# Patient Record
Sex: Male | Born: 1960
Health system: Southern US, Community
[De-identification: ages and names within clinical notes are randomized; demographics above are authoritative.]

## PROBLEM LIST (undated history)

## (undated) DIAGNOSIS — I10 Essential (primary) hypertension: Secondary | ICD-10-CM

---

## 1998-10-06 ENCOUNTER — Emergency Department (HOSPITAL_COMMUNITY): Admission: EM | Admit: 1998-10-06 | Discharge: 1998-10-06 | Payer: Self-pay | Admitting: Emergency Medicine

## 1999-03-08 ENCOUNTER — Ambulatory Visit (HOSPITAL_COMMUNITY): Admission: RE | Admit: 1999-03-08 | Discharge: 1999-03-08 | Payer: Self-pay

## 2006-11-25 ENCOUNTER — Emergency Department (HOSPITAL_COMMUNITY): Admission: EM | Admit: 2006-11-25 | Discharge: 2006-11-25 | Payer: Self-pay | Admitting: Emergency Medicine

## 2007-05-01 ENCOUNTER — Emergency Department (HOSPITAL_COMMUNITY): Admission: EM | Admit: 2007-05-01 | Discharge: 2007-05-01 | Payer: Self-pay | Admitting: Emergency Medicine

## 2007-10-04 ENCOUNTER — Emergency Department (HOSPITAL_COMMUNITY): Admission: EM | Admit: 2007-10-04 | Discharge: 2007-10-05 | Payer: Self-pay | Admitting: Emergency Medicine

## 2009-01-27 ENCOUNTER — Emergency Department (HOSPITAL_COMMUNITY): Admission: EM | Admit: 2009-01-27 | Discharge: 2009-01-27 | Payer: Self-pay | Admitting: Emergency Medicine

## 2009-09-29 ENCOUNTER — Emergency Department (HOSPITAL_COMMUNITY): Admission: EM | Admit: 2009-09-29 | Discharge: 2009-09-29 | Payer: Self-pay | Admitting: Emergency Medicine

## 2010-10-08 LAB — URINE CULTURE
Colony Count: NO GROWTH
Culture: NO GROWTH

## 2010-10-08 LAB — URINALYSIS, ROUTINE W REFLEX MICROSCOPIC
Bilirubin Urine: NEGATIVE
Glucose, UA: NEGATIVE mg/dL
Ketones, ur: NEGATIVE mg/dL
Nitrite: NEGATIVE
Protein, ur: NEGATIVE mg/dL
Specific Gravity, Urine: 1.02 (ref 1.005–1.030)
Urobilinogen, UA: 2 mg/dL — ABNORMAL HIGH (ref 0.0–1.0)
pH: 7 (ref 5.0–8.0)

## 2010-10-08 LAB — URINE MICROSCOPIC-ADD ON

## 2010-10-08 LAB — GC/CHLAMYDIA PROBE AMP, GENITAL
Chlamydia, DNA Probe: NEGATIVE
GC Probe Amp, Genital: NEGATIVE

## 2011-11-05 ENCOUNTER — Encounter (HOSPITAL_COMMUNITY): Payer: Self-pay | Admitting: *Deleted

## 2011-11-05 ENCOUNTER — Emergency Department (HOSPITAL_COMMUNITY)
Admission: EM | Admit: 2011-11-05 | Discharge: 2011-11-05 | Disposition: A | Discharge status: Home / Self Care | Payer: Self-pay | Attending: Emergency Medicine | Admitting: Emergency Medicine

## 2011-11-05 ENCOUNTER — Emergency Department (HOSPITAL_COMMUNITY): Payer: Self-pay

## 2011-11-05 DIAGNOSIS — F17200 Nicotine dependence, unspecified, uncomplicated: Secondary | ICD-10-CM | POA: Insufficient documentation

## 2011-11-05 DIAGNOSIS — R0789 Other chest pain: Secondary | ICD-10-CM

## 2011-11-05 DIAGNOSIS — R071 Chest pain on breathing: Secondary | ICD-10-CM | POA: Insufficient documentation

## 2011-11-05 MED ORDER — DIAZEPAM 5 MG PO TABS
10.0000 mg | ORAL_TABLET | Freq: Once | ORAL | Status: AC
  Administered 2011-11-05: 10 mg via ORAL
  Filled 2011-11-05: qty 2

## 2011-11-05 MED ORDER — OXYCODONE-ACETAMINOPHEN 5-325 MG PO TABS
2.0000 | ORAL_TABLET | Freq: Once | ORAL | Status: AC
  Administered 2011-11-05: 2 via ORAL
  Filled 2011-11-05: qty 2

## 2011-11-05 MED ORDER — OXYCODONE-ACETAMINOPHEN 5-325 MG PO TABS: 2.0000 | ORAL_TABLET | Freq: Four times a day (QID) | ORAL | Status: AC | PRN

## 2011-11-05 MED ORDER — KETOROLAC TROMETHAMINE 30 MG/ML IJ SOLN
30.0000 mg | Freq: Once | INTRAMUSCULAR | Status: AC
  Administered 2011-11-05: 30 mg via INTRAMUSCULAR
  Filled 2011-11-05: qty 1

## 2011-11-05 MED ORDER — DIAZEPAM 10 MG PO TABS: 5.0000 mg | ORAL_TABLET | Freq: Four times a day (QID) | ORAL | Status: AC | PRN

## 2011-11-05 MED ORDER — KETOROLAC TROMETHAMINE 10 MG PO TABS: 10.0000 mg | ORAL_TABLET | Freq: Four times a day (QID) | ORAL | Status: AC | PRN

## 2011-11-05 NOTE — ED Notes (Signed)
Pt reports right rib pain - states he had a cold x2 weeks ago however his cough never seemed to bother him. Pain is worse w/ movement and tender to palpation. Pt in no acute distress, resp even and unlabored at present.

## 2011-11-05 NOTE — ED Notes (Signed)
Rx given x3 D/c instructions reviewed w/ pt - pt denies any further questions or concerns at present.    

## 2011-11-05 NOTE — Discharge Instructions (Signed)
Chest Wall Pain Chest wall pain is pain in or around the bones and muscles of your chest. It may take up to 6 weeks to get better. It may take longer if you must stay physically active in your work and activities.  CAUSES  Chest wall pain may happen on its own. However, it may be caused by:  A viral illness like the flu.   Injury.   Coughing.   Exercise.   Arthritis.   Fibromyalgia.   Shingles.  HOME CARE INSTRUCTIONS   Avoid overtiring physical activity. Try not to strain or perform activities that cause pain. This includes any activities using your chest or your abdominal and side muscles, especially if heavy weights are used.   Put ice on the sore area.   Put ice in a plastic bag.   Place a towel between your skin and the bag.   Leave the ice on for 15 to 20 minutes per hour while awake for the first 2 days.   Only take over-the-counter or prescription medicines for pain, discomfort, or fever as directed by your caregiver.  SEEK IMMEDIATE MEDICAL CARE IF:   Your pain increases, or you are very uncomfortable.   You have a fever.   Your chest pain becomes worse.   You have new, unexplained symptoms.   You have nausea or vomiting.   You feel sweaty or lightheaded.   You have a cough with phlegm (sputum), or you cough up blood.  MAKE SURE YOU:   Understand these instructions.   Will watch your condition.   Will get help right away if you are not doing well or get worse.  Document Released: 07/01/2005 Document Revised: 06/20/2011 Document Reviewed: 02/25/2011 [employer] Patient Information 2012 [employer]. 

## 2011-11-05 NOTE — ED Notes (Signed)
The pt has had rt lower rib pain since yesterday.  The pain is much worse with movement and with respirations.  No known injury

## 2011-11-05 NOTE — ED Provider Notes (Signed)
Medical screening examination/treatment/procedure(s) were performed by non-physician practitioner and as supervising physician I was immediately available for consultation/collaboration.   Vida Roller, MD 11/05/11 [phone]
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
## 2011-11-05 NOTE — ED Notes (Signed)
Patient transported to X-ray 

## 2019-05-18 ENCOUNTER — Emergency Department (HOSPITAL_COMMUNITY)
Admission: EM | Admit: 2019-05-18 | Discharge: 2019-05-19 | Disposition: A | Discharge status: Home / Self Care | Payer: Self-pay | Attending: Emergency Medicine | Admitting: Emergency Medicine

## 2019-05-18 ENCOUNTER — Other Ambulatory Visit: Payer: Self-pay

## 2019-05-18 ENCOUNTER — Emergency Department (HOSPITAL_COMMUNITY): Payer: Self-pay

## 2019-05-18 DIAGNOSIS — F17210 Nicotine dependence, cigarettes, uncomplicated: Secondary | ICD-10-CM | POA: Insufficient documentation

## 2019-05-18 DIAGNOSIS — S93402A Sprain of unspecified ligament of left ankle, initial encounter: Secondary | ICD-10-CM | POA: Insufficient documentation

## 2019-05-18 DIAGNOSIS — M25572 Pain in left ankle and joints of left foot: Secondary | ICD-10-CM

## 2019-05-18 DIAGNOSIS — Y939 Activity, unspecified: Secondary | ICD-10-CM | POA: Insufficient documentation

## 2019-05-18 DIAGNOSIS — Y929 Unspecified place or not applicable: Secondary | ICD-10-CM | POA: Insufficient documentation

## 2019-05-18 DIAGNOSIS — W010XXA Fall on same level from slipping, tripping and stumbling without subsequent striking against object, initial encounter: Secondary | ICD-10-CM | POA: Insufficient documentation

## 2019-05-18 DIAGNOSIS — Y999 Unspecified external cause status: Secondary | ICD-10-CM | POA: Insufficient documentation

## 2019-05-18 NOTE — ED Triage Notes (Signed)
Per pt he stepped down into a hole with his left foot and twisted it. Said he rolled it. No obvious deformity.

## 2019-05-19 NOTE — ED Notes (Signed)
Ankle ASO applied

## 2019-05-19 NOTE — ED Provider Notes (Signed)
[facility] EMERGENCY DEPARTMENT Provider Note   CSN: [id number] Arrival date & time: 05/18/19  2303     History   Chief Complaint Chief Complaint  Patient presents with  . Foot Pain    HPI DYON ROTERT is a 58 y.o. male presenting for evaluation of left ankle pain.  Patient states a day before yesterday he stepped down off a large step when he sharply inverted his left ankle.  He reports acute onset pain.  Pain has been constant since, worse with movement and weightbearing.  He took 400 mg of ibuprofen yesterday which did improve his pain. He ha snot taken anything else. However as he still has pain and swelling, he came to the ED to be evaluated.  He denies injury elsewhere.  He has no medical problems and takes medications daily.  He is not on blood thinners.  He denies numbness or tingling.  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     HPI  No past medical history on file.  There are no active problems to display for this patient.   No past surgical history on file.      Home Medications    Prior to Admission medications   Not on File    Family History No family history on file.  Social History Social History   Tobacco Use  . Smoking status: Current Every Day Smoker  Substance Use Topics  . Alcohol use: Yes  . Drug use: Not on file     Allergies   Patient has no known allergies.   Review of Systems Review of Systems  Musculoskeletal: Positive for arthralgias and joint swelling.  Hematological: Does not bruise/bleed easily.     Physical Exam Updated Vital Signs BP (!) 135/117 (BP Location: Right Arm)   Pulse 92   Temp 98.1 F (36.7 C)   Resp 17   SpO2 97%   Physical Exam Vitals signs and nursing note reviewed.  Constitutional:      General: He is not in acute distress.    Appearance: He is well-developed.     Comments: Resting comfortably in the bed in no acute  distress  HENT:     Head: Normocephalic and atraumatic.  Neck:     Musculoskeletal: Normal range of motion.  Pulmonary:     Effort: Pulmonary effort is normal.  Abdominal:     General: There is no distension.  Musculoskeletal:        General: Swelling and tenderness present.     Comments: Swelling of the lateral malleolus.  Numerous palpation over the lateral malleolus.  Achilles tendon palpable and intact.  No tenderness palpation of the medial malleolus or foot.  Pedal pulse intact.  Good distal cap refill.  Full active range of motion of the toes without difficulty.  No pain of the distal lower leg.  Skin:    General: Skin is warm.     Capillary Refill: Capillary refill takes less than 2 seconds.     Findings: No rash.  Neurological:     Mental Status: He is alert and oriented to person, place, and time.      ED Treatments / Results  Labs (all labs ordered are listed, but only abnormal results are displayed) Labs Reviewed - No data to display  EKG None  Radiology Dg Ankle Complete Left  Result Date: 05/18/2019 CLINICAL DATA:  Twisting injury EXAM: LEFT ANKLE COMPLETE -  3+ VIEW COMPARISON:  None. FINDINGS: Lateral soft tissue swelling. No acute bony abnormality. Specifically, no fracture, subluxation, or dislocation. IMPRESSION: No acute bony abnormality. Electronically Signed   By: Rolm Baptise M.D.   On: 05/18/2019 23:57    Procedures Procedures (including critical care time)  Medications Ordered in ED Medications - No data to display   Initial Impression / Assessment and Plan / ED Course  I have reviewed the triage vital signs and the nursing notes.  Pertinent labs & imaging results that were available during my care of the patient were reviewed by me and considered in my medical decision making (see chart for details).        Patient presenting for evaluation of left ankle pain.  Physical exam reassuring, he appears nontoxic.  He is neurovascularly intact.   As he has swelling, pain, and an injury several days ago, will obtain x-rays.  X-rays viewed interpreted by me, no fracture or dislocation.  Discussed findings with patient.  Discussed likely ankle sprain and supportive treatment with NSAIDs, Tylenol, rest, ice, elevation.  Will give ASO brace for symptom control.  Encourage follow-up with orthopedics if symptoms not improving.  At this time, patient appears safe for discharge.  Return precautions given.  Patient states he understands and agrees to plan.  Final Clinical Impressions(s) / ED Diagnoses   Final diagnoses:  Acute left ankle pain  Sprain of left ankle, unspecified ligament, initial encounter    ED Discharge Orders    None       Franchot Heidelberg, PA-C 05/19/19 1610    Tegeler, Gwenyth Allegra, MD 05/19/19 1114

## 2019-05-19 NOTE — Discharge Instructions (Signed)
Take ibuprofen 3 times a day with meals.  Do not take other anti-inflammatories at the same time (Advil, Motrin, naproxen, Aleve). You may supplement with Tylenol if you need further pain control. Use ice packs, 20 minutes at a time, 3 times a day for pain and swelling.  Wear the brace for support and pain.  Keep your foot elevated of the neck several days to decrease pain and swelling. Follow-up with orthopedic doctor in 1 week if your pain is not proving. Return to the emergency room if you develop numbness, your foot turns white, or with any new, worsening, concerning symptoms.

## 2019-10-15 ENCOUNTER — Emergency Department (HOSPITAL_COMMUNITY)
Admission: EM | Admit: 2019-10-15 | Discharge: 2019-10-15 | Disposition: A | Discharge status: Home / Self Care | Payer: Self-pay | Attending: Emergency Medicine | Admitting: Emergency Medicine

## 2019-10-15 ENCOUNTER — Encounter (HOSPITAL_COMMUNITY): Payer: Self-pay | Admitting: Emergency Medicine

## 2019-10-15 ENCOUNTER — Other Ambulatory Visit: Payer: Self-pay

## 2019-10-15 DIAGNOSIS — F17200 Nicotine dependence, unspecified, uncomplicated: Secondary | ICD-10-CM | POA: Insufficient documentation

## 2019-10-15 DIAGNOSIS — M5441 Lumbago with sciatica, right side: Secondary | ICD-10-CM | POA: Insufficient documentation

## 2019-10-15 MED ORDER — KETOROLAC TROMETHAMINE 30 MG/ML IJ SOLN
30.0000 mg | Freq: Once | INTRAMUSCULAR | Status: AC
  Administered 2019-10-15: 15:00:00 30 mg via INTRAMUSCULAR
  Filled 2019-10-15: qty 1

## 2019-10-15 MED ORDER — LIDOCAINE 5 % EX PTCH
1.0000 | MEDICATED_PATCH | CUTANEOUS | Status: DC
  Administered 2019-10-15: 15:00:00 1 via TRANSDERMAL
  Filled 2019-10-15: qty 1

## 2019-10-15 MED ORDER — LIDOCAINE 5 % EX PTCH: 1.0000 | MEDICATED_PATCH | CUTANEOUS | 0 refills | Status: DC

## 2019-10-15 MED ORDER — METHOCARBAMOL 500 MG PO TABS: 500.0000 mg | ORAL_TABLET | Freq: Two times a day (BID) | ORAL | 0 refills | Status: DC

## 2019-10-15 MED ORDER — NAPROXEN 500 MG PO TABS: 500.0000 mg | ORAL_TABLET | Freq: Two times a day (BID) | ORAL | 0 refills | Status: DC

## 2019-10-15 NOTE — Discharge Instructions (Signed)
Take medications as prescribed.  If your pain is not resolved follow-up with orthopedics.  I have listed their contact information in your discharge paperwork.  You will need to call them to schedule an appointment.

## 2019-10-15 NOTE — ED Triage Notes (Signed)
Reports ongoing R lower back pain that has gotten worse recently with radiation to R leg, c/o pins and needle like sensation to R leg at times. States pain is worse due to his job. Denies loss of bowel or bladder.

## 2019-10-15 NOTE — ED Provider Notes (Signed)
[facility] EMERGENCY DEPARTMENT Provider Note   CSN: [id number] Arrival date & time: 10/15/19  1432    History Chief Complaint  Patient presents with  . Back Pain    Nicholas Roth is a 59 y.o. male with medical history significant for chronic back pain who presents for evaluation of back pain.  Patient was diagnosed with sciatica many years ago and typically has 2-3 flares a year.  Patient states he recently started a new job does a lot of bending, twisting, walking.  He has pain located to his right lumbar paraspinal region which radiates down his buttocks and into his posterior leg.  Denies fever, chills, nausea, vomiting, IV drug use, bowel or bladder incontinence, saddle paresthesias, malignancy, redness, swelling, warmth, abdominal pain.  Has been intermittently taking Tylenol for his pain.  Pain worse with movement.  Denies aggravating or alleviating factors.  Feels similar to his prior episodes of sciatica.  History obtained from patient and past medical records.  No interpreter is used.  HPI     History reviewed. No pertinent past medical history.  There are no problems to display for this patient.   History reviewed. No pertinent surgical history.     No family history on file.  Social History   Tobacco Use  . Smoking status: Current Every Day Smoker  Substance Use Topics  . Alcohol use: Yes  . Drug use: Not on file    Home Medications Prior to Admission medications   Medication Sig Start Date End Date Taking? Authorizing Provider  lidocaine (LIDODERM) 5 % Place 1 patch onto the skin daily. Remove & Discard patch within 12 hours or as directed by MD 10/15/19   Asuzena Weis A, PA-C  methocarbamol (ROBAXIN) 500 MG tablet Take 1 tablet (500 mg total) by mouth 2 (two) times daily. 10/15/19   Karee Forge A, PA-C  naproxen (NAPROSYN) 500 MG tablet Take 1 tablet (500 mg total) by mouth 2 (two) times daily. 10/15/19   Macie Baum A, PA-C     Allergies    Patient has no known allergies.  Review of Systems   Review of Systems  Constitutional: Negative.   HENT: Negative.   Respiratory: Negative.   Cardiovascular: Negative.   Gastrointestinal: Negative.   Genitourinary: Negative.   Musculoskeletal: Positive for back pain.  Skin: Negative.   Neurological: Negative.   All other systems reviewed and are negative.   Physical Exam Updated Vital Signs BP (!) 113/92   Pulse 87   Temp 98.5 F (36.9 C) (Oral)   Resp 16   Ht 5\' 8"  (1.727 m)   Wt 81.6 kg   SpO2 97%   BMI 27.37 kg/m   Physical Exam Physical Exam  Constitutional: Pt appears well-developed and well-nourished. No distress.  HENT:  Head: Normocephalic and atraumatic.  Mouth/Throat: Oropharynx is clear and moist. No oropharyngeal exudate.  Eyes: Conjunctivae are normal.  Neck: Normal range of motion. Neck supple.  Full ROM without pain  Cardiovascular: Normal rate, regular rhythm and intact distal pulses.   Pulmonary/Chest: Effort normal and breath sounds normal. No respiratory distress. Pt has no wheezes.  Abdominal: Soft. Pt exhibits no distension. There is no tenderness, rebound or guarding. No abd bruit or pulsatile mass Musculoskeletal:  Full range of motion of the T-spine and L-spine with flexion, hyperextension, and lateral flexion. No midline tenderness or stepoffs. No tenderness to palpation of the spinous processes of the T-spine or L-spine. Mild tenderness to palpation of the  paraspinous muscles of the RIGHT L-spine. Positive  straight leg raise on right at 40' Lymphadenopathy:    Pt has no cervical adenopathy.  Neurological: Pt is alert. Pt has normal reflexes.  Reflex Scores:      Bicep reflexes are 2+ on the right side and 2+ on the left side.      Brachioradialis reflexes are 2+ on the right side and 2+ on the left side.      Patellar reflexes are 2+ on the right side and 2+ on the left side.      Achilles reflexes are 2+ on the right  side and 2+ on the left side. Speech is clear and goal oriented, follows commands Normal 5/5 strength in upper and lower extremities bilaterally including dorsiflexion and plantar flexion, strong and equal grip strength Sensation normal to light and sharp touch Moves extremities without ataxia, coordination intact Normal gait Normal balance No Clonus Skin: Skin is warm and dry. No rash noted or lesions noted. Pt is not diaphoretic. No erythema, ecchymosis,edema or warmth.  Psychiatric: Pt has a normal mood and affect. Behavior is normal.  Nursing note and vitals reviewed. ED Results / Procedures / Treatments   Labs (all labs ordered are listed, but only abnormal results are displayed) Labs Reviewed - No data to display  EKG None  Radiology No results found.  Procedures Procedures (including critical care time)  Medications Ordered in ED Medications  ketorolac (TORADOL) 30 MG/ML injection 30 mg (has no administration in time range)  lidocaine (LIDODERM) 5 % 1 patch (has no administration in time range)    ED Course  I have reviewed the triage vital signs and the nursing notes.  Pertinent labs & imaging results that were available during my care of the patient were reviewed by me and considered in my medical decision making (see chart for details).  59 year old male presents for evaluation of back pain.  Longstanding history of sciatica.  Started a job which is physically demanding.  His pain is worse with movement.  Radiates down his posterior buttocks.  No red flags for back pain, low suspicion for acute neurosurgical emergency.  He is ambulatory without difficulty.  Heart and lungs clear.  Abdomen soft.  No overlying skin changes to suggest cellulitis.  Full range of motion bilateral hips, low suspicion for septic joint.  He does not have any new injury or trauma.  Do not feel any additional imaging at this time.  Will treat symptomatically and have him follow-up outpatient with  orthopedics.  He is to return for any new or worsening symptoms.  The patient has been appropriately medically screened and/or stabilized in the ED. I have low suspicion for any other emergent medical condition which would require further screening, evaluation or treatment in the ED or require inpatient management.    MDM Rules/Calculators/A&P                      Final Clinical Impression(s) / ED Diagnoses Final diagnoses:  Acute right-sided low back pain with right-sided sciatica    Rx / DC Orders ED Discharge Orders         Ordered    lidocaine (LIDODERM) 5 %  Every 24 hours     10/15/19 1507    naproxen (NAPROSYN) 500 MG tablet  2 times daily     10/15/19 1507    methocarbamol (ROBAXIN) 500 MG tablet  2 times daily     10/15/19 1507  Freman Lapage A, PA-C 10/15/19 1507    Arby Barrette, MD 10/26/19 [phone]

## 2019-12-08 ENCOUNTER — Emergency Department (HOSPITAL_COMMUNITY)
Admission: EM | Admit: 2019-12-08 | Discharge: 2019-12-08 | Disposition: A | Discharge status: Home / Self Care | Payer: Self-pay | Attending: Emergency Medicine | Admitting: Emergency Medicine

## 2019-12-08 ENCOUNTER — Other Ambulatory Visit: Payer: Self-pay

## 2019-12-08 ENCOUNTER — Encounter (HOSPITAL_COMMUNITY): Payer: Self-pay

## 2019-12-08 DIAGNOSIS — S161XXA Strain of muscle, fascia and tendon at neck level, initial encounter: Secondary | ICD-10-CM | POA: Insufficient documentation

## 2019-12-08 DIAGNOSIS — Y999 Unspecified external cause status: Secondary | ICD-10-CM | POA: Insufficient documentation

## 2019-12-08 DIAGNOSIS — Y939 Activity, unspecified: Secondary | ICD-10-CM | POA: Insufficient documentation

## 2019-12-08 DIAGNOSIS — X58XXXA Exposure to other specified factors, initial encounter: Secondary | ICD-10-CM | POA: Insufficient documentation

## 2019-12-08 DIAGNOSIS — Y929 Unspecified place or not applicable: Secondary | ICD-10-CM | POA: Insufficient documentation

## 2019-12-08 DIAGNOSIS — F17210 Nicotine dependence, cigarettes, uncomplicated: Secondary | ICD-10-CM | POA: Insufficient documentation

## 2019-12-08 MED ORDER — PREDNISONE 20 MG PO TABS: 40.0000 mg | ORAL_TABLET | Freq: Every day | ORAL | 0 refills | Status: AC

## 2019-12-08 MED ORDER — NAPROXEN 500 MG PO TABS: 500.0000 mg | ORAL_TABLET | Freq: Two times a day (BID) | ORAL | 0 refills | Status: AC

## 2019-12-08 MED ORDER — LIDOCAINE 5 % EX PTCH
1.0000 | MEDICATED_PATCH | Freq: Once | CUTANEOUS | Status: DC
Start: 2019-12-08 — End: 2019-12-08
  Administered 2019-12-08: 1 via TRANSDERMAL
  Filled 2019-12-08: qty 1

## 2019-12-08 MED ORDER — PREDNISONE 20 MG PO TABS
60.0000 mg | ORAL_TABLET | Freq: Once | ORAL | Status: AC
  Administered 2019-12-08: 60 mg via ORAL
  Filled 2019-12-08: qty 3

## 2019-12-08 NOTE — ED Triage Notes (Signed)
Pt reports bilateral neck pain and arm pain for the past 3 days. Pt ambulatory

## 2019-12-08 NOTE — Discharge Instructions (Addendum)
You have been seen today for neck and arm pain. Please read and follow all provided instructions. Return to the emergency room for worsening condition or new concerning symptoms.    1. Medications:  Prescription sent to your pharmacy for prednisone.  Please start taking this tomorrow.  You are already given a dose today in the emergency department.  -Prescription also sent for naproxen.  This is an anti-inflammatory pain medicine.  Please take as prescribed.  Take with food so it does not cause upset stomach.  Do not take any additional ibuprofen, Aleve, Motrin, Goody powder at these medicines are all similar.  -You can take Tylenol for pain.  Take as directed on the bottle.  Continue usual home medications Take medications as prescribed. Please review all of the medicines and only take them if you do not have an allergy to them.   2. Treatment: rest, drink plenty of fluids  3. Follow Up:  Please follow up with primary care provider by scheduling an appointment as soon as possible for a visit  If you do not have a primary care physician, contact [facility] at [phone] for referral  -I have also given you information for [facility].  They see patients without insurance.  You can call to schedule a follow-up appointment if you continue to have pain in your neck.   It is also a possibility that you have an allergic reaction to any of the medicines that you have been prescribed - Everybody reacts differently to medications and while MOST people have no trouble with most medicines, you may have a reaction such as nausea, vomiting, rash, swelling, shortness of breath. If this is the case, please stop taking the medicine immediately and contact your physician.  ?

## 2019-12-08 NOTE — ED Provider Notes (Signed)
[facility] EMERGENCY DEPARTMENT Provider Note   CSN: [id number] Arrival date & time: 12/08/19  9622     History Chief Complaint  Patient presents with  . Neck Pain  . Arm Pain    Nicholas Roth is a 59 y.o. male with past medical history significant for chronic back pain presents to emergency department today with chief complaint of progressively worsening neck and bilateral arm pain x 3 days.  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  He denies any known injury, fall or trauma.  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  He states later that night he had an aching pain in his neck.  The pain radiates to bilateral arms.  He states the pain is constant and is worse with movement.  He has tried taking Tylenol at home without any symptom relief.  He rates the pain 7 out of 10 in severity.  He denies any fever, chills, numbness, tingling, weakness, decreased sensation.  He is able to walk without any pain or difficulty.   History reviewed. No pertinent past medical history.  There are no problems to display for this patient.   History reviewed. No pertinent surgical history.     No family history on file.  Social History   Tobacco Use  . Smoking status: Current Every Day Smoker  Substance Use Topics  . Alcohol use: Yes  . Drug use: Not on file    Home Medications Prior to Admission medications   Medication Sig Start Date End Date Taking? Authorizing Provider  lidocaine (LIDODERM) 5 % Place 1 patch onto the skin daily. Remove & Discard patch within 12 hours or as directed by MD 10/15/19   Henderly, Britni A, PA-C  methocarbamol (ROBAXIN) 500 MG tablet Take 1 tablet (500 mg total) by mouth 2 (two) times daily. 10/15/19   Henderly, Britni A, PA-C  naproxen (NAPROSYN) 500 MG tablet Take 1 tablet (500 mg total) by mouth 2 (two) times daily for 7 days. 12/08/19 12/15/19  Avyonna Wagoner E, PA-C  predniSONE  (DELTASONE) 20 MG tablet Take 2 tablets (40 mg total) by mouth daily for 5 days. 12/08/19 12/13/19  Zamiah Tollett, Caroleen Hamman, PA-C    Allergies    Patient has no known allergies.  Review of Systems   Review of Systems All other systems are reviewed and are negative for acute change except as noted in the HPI.  Physical Exam Updated Vital Signs BP 126/85 (BP Location: Right Arm)   Pulse 73   Temp 97.9 F (36.6 C)   Resp 16   Ht 5\' 8"  (1.727 m)   Wt 72.6 kg   SpO2 100%   BMI 24.33 kg/m   Physical Exam Vitals and nursing note reviewed.  Constitutional:      Appearance: He is well-developed. He is not ill-appearing or toxic-appearing.  HENT:     Head: Normocephalic and atraumatic.     Nose: Nose normal.  Eyes:     General: No scleral icterus.       Right eye: No discharge.        Left eye: No discharge.     Conjunctiva/sclera: Conjunctivae normal.  Neck:     Vascular: No JVD.      Comments: Tenderness to palpation as depicted in image above. Full ROM intact without spinous process TTP. No bony stepoffs or deformities. No rigidity or meningeal signs. No bruising, erythema, or swelling.  Cardiovascular:  Rate and Rhythm: Normal rate and regular rhythm.     Pulses: Normal pulses.          Radial pulses are 2+ on the right side and 2+ on the left side.     Heart sounds: Normal heart sounds.  Pulmonary:     Effort: Pulmonary effort is normal.     Breath sounds: Normal breath sounds.  Abdominal:     General: There is no distension.  Musculoskeletal:        General: Normal range of motion.     Right shoulder: Normal.     Left shoulder: Normal.     Right elbow: Normal.     Left elbow: Normal. Effusion:       Right wrist: Normal.     Left wrist: Normal.     Cervical back: Normal range of motion.     Comments: Bilateral upper extremities are neurovascularly intact distally. Compartments soft above and below affected joint.   Full range of motion of the T-spine and L-spine  No tenderness to palpation of the spinous processes of the T-spine or L-spine No crepitus, deformity or step-offs No tenderness to palpation of the paraspinous muscles of the L-spine      Skin:    General: Skin is warm and dry.  Neurological:     Mental Status: He is oriented to person, place, and time.     GCS: GCS eye subscore is 4. GCS verbal subscore is 5. GCS motor subscore is 6.     Comments: Mental Status:  Alert, oriented, thought content appropriate, able to give a coherent history. Speech fluent without evidence of aphasia. Able to follow 2 step commands without difficulty.  Cranial Nerves:  II:  Peripheral visual fields grossly normal, pupils equal, round, reactive to light III,IV, VI: ptosis not present, extra-ocular motions intact bilaterally  V,VII: smile symmetric, facial light touch sensation equal VIII: hearing grossly normal to voice  X: uvula elevates symmetrically  XI: bilateral shoulder shrug symmetric and strong XII: midline tongue extension without fassiculations Motor:  Normal tone. 5/5 in upper and lower extremities bilaterally including strong and equal grip strength and dorsiflexion/plantar flexion Sensory: Pinprick and light touch normal in all extremities.  Deep Tendon Reflexes: 2+ and symmetric in the biceps and patella Cerebellar: normal finger-to-nose with bilateral upper extremities Gait: normal gait and balance CV: distal pulses palpable throughout     Psychiatric:        Behavior: Behavior normal.     ED Results / Procedures / Treatments   Labs (all labs ordered are listed, but only abnormal results are displayed) Labs Reviewed - No data to display  EKG None  Radiology No results found.  Procedures Procedures (including critical care time)  Medications Ordered in ED Medications  lidocaine (LIDODERM) 5 % 1 patch (1 patch Transdermal Patch Applied 12/08/19 1034)  predniSONE (DELTASONE) tablet 60 mg (60 mg Oral Given 12/08/19 1033)     ED Course  I have reviewed the triage vital signs and the nursing notes.  Pertinent labs & imaging results that were available during my care of the patient were reviewed by me and considered in my medical decision making (see chart for details).    MDM Rules/Calculators/A&P                     History provided by patient with additional history obtained from chart review.    Patient seen and examined. Patient presents awake, alert, hemodynamically stable, afebrile,  non toxic. On exam he has tenderness to palpation of bilateral paraspinal muscles of cervical spine.  No midline tenderness.  No step-off crepitus or deformity.  He has full range of motion of bilateral arms.  Compartments are soft in bilateral upper extremities.  Radial pulse 2+ bilaterally.  He ambulates with without any deficits noted. Normal neuro exam. Pain likely caused by MSK strain, pinched nerve.  Lidocaine patch applied to neck and patient given dose of prednisone here in the emergency department.  Will discharge home with symptomatic care including naproxen and a short prednisone burst. The patient appears reasonably screened and/or stabilized for discharge and I doubt any other medical condition or other [facility] requiring further screening, evaluation, or treatment in the ED at this time prior to discharge. The patient is safe for discharge with strict return precautions discussed. Recommend pcp follow up if symptoms persist.  Patient given resources for [facility] health community health and wellness clinic as he does not have a PCP.   Portions of this note were generated with Lobbyist. Dictation errors may occur despite best attempts at proofreading.    Final Clinical Impression(s) / ED Diagnoses Final diagnoses:  Acute strain of neck muscle, initial encounter    Rx / DC Orders ED Discharge Orders         Ordered    naproxen (NAPROSYN) 500 MG tablet  2 times daily     12/08/19 1010    predniSONE  (DELTASONE) 20 MG tablet  Daily     12/08/19 1010           Cherre Robins, PA-C 12/08/19 Dumont, Ankit, MD 12/09/19 1149

## 2020-01-18 ENCOUNTER — Encounter (HOSPITAL_COMMUNITY): Payer: Self-pay | Admitting: Emergency Medicine

## 2020-01-18 ENCOUNTER — Emergency Department (HOSPITAL_COMMUNITY): Payer: Self-pay

## 2020-01-18 ENCOUNTER — Emergency Department (HOSPITAL_COMMUNITY)
Admission: EM | Admit: 2020-01-18 | Discharge: 2020-01-18 | Disposition: A | Discharge status: Home / Self Care | Payer: Self-pay | Attending: Emergency Medicine | Admitting: Emergency Medicine

## 2020-01-18 ENCOUNTER — Other Ambulatory Visit: Payer: Self-pay

## 2020-01-18 DIAGNOSIS — F17210 Nicotine dependence, cigarettes, uncomplicated: Secondary | ICD-10-CM | POA: Insufficient documentation

## 2020-01-18 DIAGNOSIS — S161XXS Strain of muscle, fascia and tendon at neck level, sequela: Secondary | ICD-10-CM | POA: Insufficient documentation

## 2020-01-18 DIAGNOSIS — Z79899 Other long term (current) drug therapy: Secondary | ICD-10-CM | POA: Insufficient documentation

## 2020-01-18 DIAGNOSIS — X58XXXS Exposure to other specified factors, sequela: Secondary | ICD-10-CM | POA: Insufficient documentation

## 2020-01-18 LAB — CBC
HCT: 43.6 % (ref 39.0–52.0)
Hemoglobin: 14.5 g/dL (ref 13.0–17.0)
MCH: 31.6 pg (ref 26.0–34.0)
MCHC: 33.3 g/dL (ref 30.0–36.0)
MCV: 95 fL (ref 80.0–100.0)
Platelets: 245 10*3/uL (ref 150–400)
RBC: 4.59 MIL/uL (ref 4.22–5.81)
RDW: 14.3 % (ref 11.5–15.5)
WBC: 6 10*3/uL (ref 4.0–10.5)
nRBC: 0 % (ref 0.0–0.2)

## 2020-01-18 LAB — BASIC METABOLIC PANEL
Anion gap: 12 (ref 5–15)
BUN: 5 mg/dL — ABNORMAL LOW (ref 6–20)
CO2: 23 mmol/L (ref 22–32)
Calcium: 8.5 mg/dL — ABNORMAL LOW (ref 8.9–10.3)
Chloride: 102 mmol/L (ref 98–111)
Creatinine, Ser: 0.97 mg/dL (ref 0.61–1.24)
GFR calc Af Amer: >60 mL/min (ref >60)
GFR calc non Af Amer: >60 mL/min (ref >60)
Glucose, Bld: 102 mg/dL — ABNORMAL HIGH (ref 70–99)
Potassium: 3.2 mmol/L — ABNORMAL LOW (ref 3.5–5.1)
Sodium: 137 mmol/L (ref 135–145)

## 2020-01-18 MED ORDER — CYCLOBENZAPRINE HCL 10 MG PO TABS: 10.0000 mg | ORAL_TABLET | Freq: Three times a day (TID) | ORAL | 0 refills | Status: AC

## 2020-01-18 MED ORDER — KETOROLAC TROMETHAMINE 30 MG/ML IJ SOLN
30.0000 mg | Freq: Once | INTRAMUSCULAR | Status: AC
  Administered 2020-01-18: 30 mg via INTRAMUSCULAR
  Filled 2020-01-18: qty 1

## 2020-01-18 MED ORDER — PREDNISONE 20 MG PO TABS: 20.0000 mg | ORAL_TABLET | Freq: Every day | ORAL | 0 refills | Status: AC

## 2020-01-18 NOTE — ED Provider Notes (Signed)
[facility] EMERGENCY DEPARTMENT Provider Note   CSN: [id number] Arrival date & time: 01/18/20  1518     History Chief Complaint  Patient presents with  . Neck Pain    Nicholas Roth is a 59 y.o. male.  59 y.o male with a PMH of neck pain presents to the ED with a chief complaint of right sided neck pain x 1 month. He describes a sharp pain to the right sided of his neck "pins and needles" which radiates down his right arm. Pain is exacerbated in the morning especially when getting out of bed. He was previously seen in the ED, for the same complaint reports there was improvement with the medication he took however pain has not returned.  He was encouraged to follow-up with a specialist, reports he was unable to do so due to financial strain.  He has tried Tylenol, ibuprofen, heat pad with some improvement.  Patient is currently employed as a Museum/gallery exhibitions officer. No fever, no headache, no changes in vision, no chest pain or shortness of breath.     The history is provided by the patient.  Neck Pain Associated symptoms: no chest pain, no fever, no headaches and no weakness        History reviewed. No pertinent past medical history.  There are no problems to display for this patient.   History reviewed. No pertinent surgical history.     No family history on file.  Social History   Tobacco Use  . Smoking status: Current Every Day Smoker  Substance Use Topics  . Alcohol use: Yes  . Drug use: Not on file    Home Medications Prior to Admission medications   Medication Sig Start Date End Date Taking? Authorizing Provider  cyclobenzaprine (FLEXERIL) 10 MG tablet Take 1 tablet (10 mg total) by mouth 3 (three) times daily for 7 days. 01/18/20 01/25/20  Claude Manges, PA-C  lidocaine (LIDODERM) 5 % Place 1 patch onto the skin daily. Remove & Discard patch within 12 hours or as directed by MD 10/15/19   Henderly, Britni A, PA-C  methocarbamol (ROBAXIN) 500 MG  tablet Take 1 tablet (500 mg total) by mouth 2 (two) times daily. 10/15/19   Henderly, Britni A, PA-C  predniSONE (DELTASONE) 20 MG tablet Take 1 tablet (20 mg total) by mouth daily for 5 days. 01/18/20 01/23/20  Claude Manges, PA-C    Allergies    Patient has no known allergies.  Review of Systems   Review of Systems  Constitutional: Negative for fever.  Respiratory: Negative for shortness of breath.   Cardiovascular: Negative for chest pain.  Musculoskeletal: Positive for neck pain.  Neurological: Negative for syncope, weakness, light-headedness and headaches.  All other systems reviewed and are negative.   Physical Exam Updated Vital Signs BP 114/82   Pulse 88   Temp 98.4 F (36.9 C) (Oral)   Resp 18   SpO2 96%   Physical Exam Vitals and nursing note reviewed.  Constitutional:      Appearance: Normal appearance. He is not ill-appearing or toxic-appearing.  HENT:     Head: Normocephalic and atraumatic.     Nose: Nose normal.     Mouth/Throat:     Mouth: Mucous membranes are moist.  Eyes:     Pupils: Pupils are equal, round, and reactive to light.  Neck:      Comments: Pain with range of motion of the right shoulder, described as a pulling sensation. No erythema or edema noted.  Cardiovascular:     Rate and Rhythm: Normal rate.  Pulmonary:     Effort: Pulmonary effort is normal.     Breath sounds: No wheezing or rales.  Abdominal:     General: Abdomen is flat.  Musculoskeletal:     Cervical back: Normal range of motion and neck supple. No erythema, signs of trauma, rigidity, torticollis or crepitus. Pain with movement and muscular tenderness present. Normal range of motion.  Skin:    General: Skin is warm and dry.  Neurological:     Mental Status: He is alert and oriented to person, place, and time.     Comments: Alert, oriented, thought content appropriate. Speech fluent without evidence of aphasia. Able to follow 2 step commands without difficulty.  Cranial Nerves:    II:  Peripheral visual fields grossly normal, pupils, round, reactive to light III,IV, VI: ptosis not present, extra-ocular motions intact bilaterally  V,VII: smile symmetric, facial light touch sensation equal VIII: hearing grossly normal bilaterally  IX,X: midline uvula rise  XI: bilateral shoulder shrug equal and strong XII: midline tongue extension  Motor:  5/5 in upper and lower extremities bilaterally including strong and equal grip strength and dorsiflexion/plantar flexion Sensory: light touch normal in all extremities.  Cerebellar: normal finger-to-nose with bilateral upper extremities, pronator drift negative      ED Results / Procedures / Treatments   Labs (all labs ordered are listed, but only abnormal results are displayed) Labs Reviewed  BASIC METABOLIC PANEL - Abnormal; Notable for the following components:      Result Value   Potassium 3.2 (*)    Glucose, Bld 102 (*)    BUN 5 (*)    Calcium 8.5 (*)    All other components within normal limits  CBC    EKG None  Radiology DG Cervical Spine Complete  Result Date: 01/18/2020 CLINICAL DATA:  Neck pain radiating into the right shoulder for 1 month, no known injury, initial encounter EXAM: CERVICAL SPINE - COMPLETE 4+ VIEW COMPARISON:  None. FINDINGS: Seven cervical segments are well visualized. Vertebral body height is well maintained. Mild straightening of the normal cervical lordosis is noted. Mild facet hypertrophic changes and osteophytic changes are seen. No prevertebral soft tissue abnormality is seen. No neural foraminal narrowing is noted. The odontoid is within normal limits. 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 Electronically Signed   By: Alcide Clever M.D.   On: 01/18/2020 20:26    Procedures Procedures (including critical care time)  Medications Ordered in ED Medications  ketorolac (TORADOL) 30 MG/ML injection 30 mg (has no administration in time range)    ED Course   I have reviewed the triage vital signs and the nursing notes.  Pertinent labs & imaging results that were available during my care of the patient were reviewed by me and considered in my medical decision making (see chart for details).    MDM Rules/Calculators/A&P   Patient with a past medical history of neck pain presents to the ED with complaints of right-sided neck pain which began 1 month ago.  He was evaluated in the ED in the month of May, prescribed steroids to help with pain.  Advised to follow-up with PCP which she does not have but given the community health and wellness clinic.  During evaluation patient is overall well-appearing, resting talking on the phone holding it with his left hand.  Reports pain on movement of the neck especially to the right side.  Has not had any headache,  weakness, blurry vision.  Vitals are within normal limits, does not have any neck rigidity and has full range of motion of his neck with some pain along the muscular distribution.  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  These results were discussed at length with patient. We discussed appropriate follow up with PCP, given a prescription for muscle relaxers along with a steroid burst. Vitals are within normal limits, no neurological deficits. Patient is stable for discharge. Return precautions discussed at length.    Portions of this note were generated with Scientist, clinical (histocompatibility and immunogenetics). Dictation errors may occur despite best attempts at proofreading.  Final Clinical Impression(s) / ED Diagnoses Final diagnoses:  Strain of neck muscle, sequela    Rx / DC Orders ED Discharge Orders         Ordered    predniSONE (DELTASONE) 20 MG tablet  Daily     Discontinue  Reprint     01/18/20 2042    cyclobenzaprine (FLEXERIL) 10 MG tablet  3 times daily     Discontinue  Reprint     01/18/20 2042           Claude Manges, PA-C 01/18/20 2047    Linwood Dibbles,  MD 01/18/20 [phone]

## 2020-01-18 NOTE — ED Triage Notes (Signed)
Pt c/o neck pain that radiates to his right shoulder x 1 month. Pt c/o weakness to his right hand, grip strength equal, no drift noted. Denies injury.

## 2020-01-18 NOTE — Discharge Instructions (Signed)
We discussed the results of your xray, I will prescribe medication for pain control. Please be aware that this will likely improve but you need some primary care follow up.  I have attached the contact information for a specialist along with [place] [facility] wellness clinic so you can establish some primary care.   Please be aware that steroids can cause insomnia, appetite changes, flushness.  Also the muscle relaxers prescribed to you on today's visit can make you drowsy, do not drink alcohol or drive while taking this medication.

## 2020-01-23 ENCOUNTER — Other Ambulatory Visit: Payer: Self-pay

## 2020-01-23 ENCOUNTER — Emergency Department (HOSPITAL_COMMUNITY)
Admission: EM | Admit: 2020-01-23 | Discharge: 2020-01-23 | Disposition: A | Discharge status: Home / Self Care | Payer: Self-pay | Attending: Emergency Medicine | Admitting: Emergency Medicine

## 2020-01-23 DIAGNOSIS — Y929 Unspecified place or not applicable: Secondary | ICD-10-CM | POA: Insufficient documentation

## 2020-01-23 DIAGNOSIS — Y939 Activity, unspecified: Secondary | ICD-10-CM | POA: Insufficient documentation

## 2020-01-23 DIAGNOSIS — X58XXXD Exposure to other specified factors, subsequent encounter: Secondary | ICD-10-CM | POA: Insufficient documentation

## 2020-01-23 DIAGNOSIS — S161XXD Strain of muscle, fascia and tendon at neck level, subsequent encounter: Secondary | ICD-10-CM | POA: Insufficient documentation

## 2020-01-23 DIAGNOSIS — Z79899 Other long term (current) drug therapy: Secondary | ICD-10-CM | POA: Insufficient documentation

## 2020-01-23 DIAGNOSIS — Y999 Unspecified external cause status: Secondary | ICD-10-CM | POA: Insufficient documentation

## 2020-01-23 DIAGNOSIS — F17200 Nicotine dependence, unspecified, uncomplicated: Secondary | ICD-10-CM | POA: Insufficient documentation

## 2020-01-23 LAB — COMPREHENSIVE METABOLIC PANEL
ALT: 79 U/L — ABNORMAL HIGH (ref 0–44)
AST: 87 U/L — ABNORMAL HIGH (ref 15–41)
Albumin: 3.2 g/dL — ABNORMAL LOW (ref 3.5–5.0)
Alkaline Phosphatase: 42 U/L (ref 38–126)
Anion gap: 9 (ref 5–15)
BUN: 11 mg/dL (ref 6–20)
CO2: 25 mmol/L (ref 22–32)
Calcium: 8.7 mg/dL — ABNORMAL LOW (ref 8.9–10.3)
Chloride: 104 mmol/L (ref 98–111)
Creatinine, Ser: 1.03 mg/dL (ref 0.61–1.24)
GFR calc Af Amer: >60 mL/min (ref >60)
GFR calc non Af Amer: >60 mL/min (ref >60)
Glucose, Bld: 119 mg/dL — ABNORMAL HIGH (ref 70–99)
Potassium: 3.8 mmol/L (ref 3.5–5.1)
Sodium: 138 mmol/L (ref 135–145)
Total Bilirubin: 0.7 mg/dL (ref 0.3–1.2)
Total Protein: 7.5 g/dL (ref 6.5–8.1)

## 2020-01-23 LAB — CBC WITH DIFFERENTIAL/PLATELET
Abs Immature Granulocytes: 0.02 10*3/uL (ref 0.00–0.07)
Basophils Absolute: 0 10*3/uL (ref 0.0–0.1)
Basophils Relative: 1 %
Eosinophils Absolute: 0.1 10*3/uL (ref 0.0–0.5)
Eosinophils Relative: 2 %
HCT: 43.6 % (ref 39.0–52.0)
Hemoglobin: 14.2 g/dL (ref 13.0–17.0)
Immature Granulocytes: 0 %
Lymphocytes Relative: 39 %
Lymphs Abs: 2.4 10*3/uL (ref 0.7–4.0)
MCH: 31.6 pg (ref 26.0–34.0)
MCHC: 32.6 g/dL (ref 30.0–36.0)
MCV: 96.9 fL (ref 80.0–100.0)
Monocytes Absolute: 1.1 10*3/uL — ABNORMAL HIGH (ref 0.1–1.0)
Monocytes Relative: 17 %
Neutro Abs: 2.5 10*3/uL (ref 1.7–7.7)
Neutrophils Relative %: 41 %
Platelets: 228 10*3/uL (ref 150–400)
RBC: 4.5 MIL/uL (ref 4.22–5.81)
RDW: 14.6 % (ref 11.5–15.5)
WBC: 6.2 10*3/uL (ref 4.0–10.5)
nRBC: 0 % (ref 0.0–0.2)

## 2020-01-23 LAB — TROPONIN I (HIGH SENSITIVITY): Troponin I (High Sensitivity): 5 ng/L (ref <18)

## 2020-01-23 MED ORDER — OXYCODONE-ACETAMINOPHEN 5-325 MG PO TABS
1.0000 | ORAL_TABLET | Freq: Once | ORAL | Status: AC
  Administered 2020-01-23: 1 via ORAL
  Filled 2020-01-23: qty 1

## 2020-01-23 MED ORDER — LIDOCAINE 5 % EX PTCH
1.0000 | MEDICATED_PATCH | CUTANEOUS | Status: DC
  Administered 2020-01-23: 1 via TRANSDERMAL
  Filled 2020-01-23: qty 1

## 2020-01-23 NOTE — ED Provider Notes (Signed)
[facility] EMERGENCY DEPARTMENT Provider Note   CSN: [id number] Arrival date & time: 01/23/20  0326     History Chief Complaint  Patient presents with  . Neck Pain    Nicholas Roth is a 59 y.o. male with no pertinent past medical history that presents to the emergency department today for neck pain.  Patient states that he has been having neck pain for about a month now.  Patient was seen in the ER for neck pain in May and then again in July.  Was last seen July 6.  He said that his neck pain is on the right side then, states that it is worse now on the left side.  Patient was supposed to see a specialist, was unable to due to financial problems.  He has tried Tylenol, ibuprofen, heat pad with no improvement.  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  X-rays done at that time showed multilevel degenerative change without any acute abnormality.  He was given prednisone and Flexeril at that time, patient states that he has been taking them however neck pain started again that yesterday.  Describes the pain as sharp and hurts when he moves his neck or his arm.  Worse on left side.  Does not radiate into his arms.  No numbness or tingling.  Denies any chest pain or shortness of breath.  Denies any new back pain.  Denies any abdominal pain, nausea, vomiting.  Denies any fevers, URI-like symptoms, headache, vision changes.  Patient states that he has not been around any sick contacts. HPI     No past medical history on file.  There are no problems to display for this patient.   No past surgical history on file.     No family history on file.  Social History   Tobacco Use  . Smoking status: Current Every Day Smoker  Substance Use Topics  . Alcohol use: Yes  . Drug use: Not on file    Home Medications Prior to Admission medications   Medication Sig Start Date End Date Taking? Authorizing Provider  acetaminophen (TYLENOL) 500 MG tablet Take 500 mg by  mouth every 6 (six) hours as needed for moderate pain.   Yes [provider]  predniSONE (DELTASONE) 20 MG tablet Take 1 tablet (20 mg total) by mouth daily for 5 days. 01/18/20 01/23/20 Yes Soto, Johana, PA-C  cyclobenzaprine (FLEXERIL) 10 MG tablet Take 1 tablet (10 mg total) by mouth 3 (three) times daily for 7 days. 01/18/20 01/25/20  Claude Manges, PA-C  lidocaine (LIDODERM) 5 % Place 1 patch onto the skin daily. Remove & Discard patch within 12 hours or as directed by MD 10/15/19   Henderly, Britni A, PA-C  methocarbamol (ROBAXIN) 500 MG tablet Take 1 tablet (500 mg total) by mouth 2 (two) times daily. 10/15/19   Henderly, Britni A, PA-C    Allergies    Patient has no known allergies.  Review of Systems   Review of Systems  Constitutional: Negative for diaphoresis, fatigue and fever.  Eyes: Negative for visual disturbance.  Respiratory: Negative for shortness of breath.   Cardiovascular: Negative for chest pain, palpitations and leg swelling.  Gastrointestinal: Negative for abdominal pain, nausea and vomiting.  Musculoskeletal: Positive for neck pain. Negative for back pain, myalgias and neck stiffness.  Skin: Negative for color change, pallor, rash and wound.  Neurological: Negative for syncope, weakness, light-headedness, numbness and headaches.  Psychiatric/Behavioral: Negative for behavioral problems and confusion.  Physical Exam Updated Vital Signs BP 104/81 (BP Location: Right Arm)   Pulse (!) 53   Temp 98.5 F (36.9 C) (Oral)   Resp 14   Ht 5\' 8"  (1.727 m)   Wt 72.6 kg   SpO2 98%   BMI 24.33 kg/m   Physical Exam Constitutional:      General: He is not in acute distress.    Appearance: Normal appearance. He is not ill-appearing, toxic-appearing or diaphoretic.  Neck:      Comments: Patient is able to move neck in all directions, pain as depicted in picture over paraspinal cervical muscles, bilaterally but more on left side.  No erythema or rashes overlying this.   Full range of motion of neck.  No midline tenderness to cervical, thoracic, lumbar spine. Cardiovascular:     Rate and Rhythm: Normal rate and regular rhythm.     Pulses: Normal pulses.  Pulmonary:     Effort: Pulmonary effort is normal.     Breath sounds: Normal breath sounds.  Musculoskeletal:        General: Normal range of motion.     Cervical back: Normal range of motion and neck supple. No edema or rigidity. Pain with movement present. Normal range of motion.  Lymphadenopathy:     Cervical: No cervical adenopathy.     Right cervical: No superficial, deep or posterior cervical adenopathy.    Left cervical: No superficial, deep or posterior cervical adenopathy.  Skin:    General: Skin is warm and dry.     Capillary Refill: Capillary refill takes less than 2 seconds.  Neurological:     General: No focal deficit present.     Mental Status: He is alert and oriented to person, place, and time.  Psychiatric:        Mood and Affect: Mood normal.        Behavior: Behavior normal.        Thought Content: Thought content normal.     ED Results / Procedures / Treatments   Labs (all labs ordered are listed, but only abnormal results are displayed) Labs Reviewed  CBC WITH DIFFERENTIAL/PLATELET - Abnormal; Notable for the following components:      Result Value   Monocytes Absolute 1.1 (*)    All other components within normal limits  COMPREHENSIVE METABOLIC PANEL - Abnormal; Notable for the following components:   Glucose, Bld 119 (*)    Calcium 8.7 (*)    Albumin 3.2 (*)    AST 87 (*)    ALT 79 (*)    All other components within normal limits  TROPONIN I (HIGH SENSITIVITY)    EKG EKG Interpretation  Date/Time:  Sunday January 23 2020 09:18:01 EDT Ventricular Rate:  54 PR Interval:    QRS Duration: 92 QT Interval:  430 QTC Calculation: 408 R Axis:   -3 Text Interpretation: Sinus rhythm Confirmed by [id number] [phone]) on 01/23/2020 9:27:37 AM   Radiology No results  found.  Procedures Procedures (including critical care time)  Medications Ordered in ED Medications  lidocaine (LIDODERM) 5 % 1 patch (1 patch Transdermal Patch Applied 01/23/20 0923)  oxyCODONE-acetaminophen (PERCOCET/ROXICET) 5-325 MG per tablet 1 tablet (1 tablet Oral Given 01/23/20 1025)    ED Course  I have reviewed the triage vital signs and the nursing notes.  Pertinent labs & imaging results that were available during my care of the patient were reviewed by me and considered in my medical decision making (see chart for details).  MDM Rules/Calculators/A&P                          CJ EDGELL is a 60 y.o. male with no pertinent past medical history that presents to the emergency department today for neck pain.  This is patient's third visit for neck pain in the past 2 months.  Patient states that pain is worse on his left side, will obtain EKG since this has not been done yet.  Patient is not complaining of any chest pain or shortness of breath.  This is most likely muscle strain as directed by previous providers.  Patient to be given lidocaine patch in the ER today and discharged home with lidocaine patch prescription. EKG with possible mild ST elevations in inferior leads, did speak to Dr. Madilyn Hook about this who is not concerned for STEMI. Patient states that he doesn't have cardiac history, states that his dad died  of heart attack, thinks he was young. Patient doesn't seem to be most reliable historian, will order basic labs at this time.  CBC and CMP without any acute abnormalities.  First troponin 5.  Do not need repeat troponin at this time.  Patient continues to deny chest pain.  After reassessment, patient states that pain is better after Percocet.  Patient to be discharged home, patient states that he did not pick up his lidocaine patches and did not see specialist.  Says he will do both of those things when he can.  Will provide resources on neck charges on cervical  strain.  Doubt need for further emergent work up at this time. I explained the diagnosis and have given explicit precautions to return to the ER including for any other new or worsening symptoms. The patient understands and accepts the medical plan as it's been dictated and I have answered their questions. Discharge instructions concerning home care and prescriptions have been given. The patient is STABLE and is discharged to home in good condition.    Final Clinical Impression(s) / ED Diagnoses Final diagnoses:  Cervical strain, subsequent encounter    Rx / DC Orders ED Discharge Orders    None       Farrel Gordon, PA-C 01/23/20 1122    Terrilee Files, MD 01/23/20 [phone]

## 2020-01-23 NOTE — ED Notes (Signed)
No answer from pt in waiting room 

## 2020-01-23 NOTE — ED Triage Notes (Signed)
Pt has been having left sided neck pain for a bout 1 month. Pt was here 3 days ago and was given a shot and said was better but today started hurting again. Runs down into his left shoulder

## 2020-01-23 NOTE — Discharge Instructions (Addendum)
You should were seen today for cervical strain, as we spoke about you need to see a specialist about this, the one you refer to last time.  I want you to use Tylenol as prescribed on the bottle for your pain.   You were given Lidoderm patches last time, I want you to pick these up from the pharmacy, they will help with the pain.  If you start any chest pain or shortness of breath please return to the emergency department.  Use the attached instructions.  If you have any new or worsening concerning symptoms come back to the ER.  Liver enzymes were slightly elevated, you can have these rechecked with a primary care in the next week.

## 2020-06-19 ENCOUNTER — Emergency Department (HOSPITAL_COMMUNITY)
Admission: EM | Admit: 2020-06-19 | Discharge: 2020-06-20 | Disposition: A | Discharge status: Home / Self Care | Payer: Self-pay | Attending: Emergency Medicine | Admitting: Emergency Medicine

## 2020-06-19 ENCOUNTER — Encounter (HOSPITAL_COMMUNITY): Payer: Self-pay

## 2020-06-19 ENCOUNTER — Other Ambulatory Visit: Payer: Self-pay

## 2020-06-19 DIAGNOSIS — X58XXXA Exposure to other specified factors, initial encounter: Secondary | ICD-10-CM | POA: Insufficient documentation

## 2020-06-19 DIAGNOSIS — T1591XA Foreign body on external eye, part unspecified, right eye, initial encounter: Secondary | ICD-10-CM | POA: Insufficient documentation

## 2020-06-19 DIAGNOSIS — F17200 Nicotine dependence, unspecified, uncomplicated: Secondary | ICD-10-CM | POA: Insufficient documentation

## 2020-06-19 DIAGNOSIS — T1590XA Foreign body on external eye, part unspecified, unspecified eye, initial encounter: Secondary | ICD-10-CM

## 2020-06-19 MED ORDER — FLUORESCEIN SODIUM 1 MG OP STRP
1.0000 | ORAL_STRIP | Freq: Once | OPHTHALMIC | Status: DC
  Filled 2020-06-19: qty 1

## 2020-06-19 MED ORDER — TETRACAINE HCL 0.5 % OP SOLN
2.0000 [drp] | Freq: Once | OPHTHALMIC | Status: DC
  Filled 2020-06-19: qty 4

## 2020-06-19 NOTE — ED Triage Notes (Signed)
Pt states that he was at work today and got some white power in his eye, unknown what it was, pt washed it out, R eye is still irritated.

## 2020-06-20 ENCOUNTER — Other Ambulatory Visit: Payer: Self-pay

## 2020-06-20 MED ORDER — TETRACAINE HCL 0.5 % OP SOLN
2.0000 [drp] | Freq: Once | OPHTHALMIC | Status: AC
  Administered 2020-06-20: 2 [drp] via OPHTHALMIC

## 2020-06-20 MED ORDER — HYPROMELLOSE (GONIOSCOPIC) 2.5 % OP SOLN: 1.0000 [drp] | Freq: Four times a day (QID) | OPHTHALMIC | 12 refills | Status: AC | PRN

## 2020-06-20 MED ORDER — FLUORESCEIN SODIUM 1 MG OP STRP
1.0000 | ORAL_STRIP | Freq: Once | OPHTHALMIC | Status: AC
  Administered 2020-06-20: 1 via OPHTHALMIC

## 2020-06-20 NOTE — ED Notes (Signed)
Pt refusing to have eyes flushed, states he just wants to go home

## 2020-06-20 NOTE — Discharge Instructions (Signed)
We did not see any signs of persistent foreign body in the eyes.  Follow-up with an eye doctor if the symptoms do not improve by the end of the week.  Return sooner for worsening symptoms difficulty with vision

## 2020-06-20 NOTE — ED Provider Notes (Signed)
[facility] EMERGENCY DEPARTMENT Provider Note   CSN: [id number] Arrival date & time: 06/19/20  2338     History Chief Complaint  Patient presents with  . Eye Pain    Nicholas Roth is a 59 y.o. male.  HPI   Patient presents to the ED for evaluation of eye irritation.  Patient was at work yesterday when he got some type of white powder in his eyes.  He is not exactly sure what it was but states it might have been something used to make deodorant.  Patient states he has had some irritation since then.  He washed it out but it still feels somewhat irritated.  He is not having blurred vision but thinks that sometimes he sees spots.  He has not had any drainage.  History reviewed. No pertinent past medical history.  There are no problems to display for this patient.   History reviewed. No pertinent surgical history.     No family history on file.  Social History   Tobacco Use  . Smoking status: Current Every Day Smoker  Substance Use Topics  . Alcohol use: Yes  . Drug use: Not on file    Home Medications Prior to Admission medications   Medication Sig Start Date End Date Taking? Authorizing Provider  acetaminophen (TYLENOL) 500 MG tablet Take 500 mg by mouth every 6 (six) hours as needed for moderate pain.    [provider]  hydroxypropyl methylcellulose / hypromellose (ISOPTO TEARS / GONIOVISC) 2.5 % ophthalmic solution Place 1 drop into both eyes 4 (four) times daily as needed for up to 7 days for dry eyes. 06/20/20 06/27/20  Linwood Dibbles, MD  lidocaine (LIDODERM) 5 % Place 1 patch onto the skin daily. Remove & Discard patch within 12 hours or as directed by MD 10/15/19   Henderly, Britni A, PA-C  methocarbamol (ROBAXIN) 500 MG tablet Take 1 tablet (500 mg total) by mouth 2 (two) times daily. 10/15/19   Henderly, Britni A, PA-C    Allergies    Patient has no known allergies.  Review of Systems   Review of Systems  Constitutional: Negative  for fever.  Eyes: Negative for discharge and redness.  Respiratory: Negative for shortness of breath.     Physical Exam Updated Vital Signs BP (!) 140/99 (BP Location: Right Arm)   Pulse (!) 57   Temp 97.7 F (36.5 C) (Oral)   Resp 17   Ht 1.727 m (5\' 8" )   Wt 72.6 kg   SpO2 100%   BMI 24.34 kg/m   Physical Exam Vitals and nursing note reviewed.  Constitutional:      General: He is not in acute distress.    Appearance: He is well-developed.  HENT:     Head: Normocephalic and atraumatic.     Right Ear: External ear normal.     Left Ear: External ear normal.  Eyes:     General: No scleral icterus.       Right eye: No discharge.        Left eye: No discharge.     Extraocular Movements: Extraocular movements intact.     Conjunctiva/sclera: Conjunctivae normal.     Pupils: Pupils are equal, round, and reactive to light.     Comments: No abrasion or fluorescein uptake noted bilaterally  Neck:     Trachea: No tracheal deviation.  Cardiovascular:     Rate and Rhythm: Normal rate.  Pulmonary:     Effort: Pulmonary effort  is normal. No respiratory distress.     Breath sounds: No stridor.  Abdominal:     General: There is no distension.  Musculoskeletal:        General: No swelling or deformity.     Cervical back: Neck supple.  Skin:    General: Skin is warm and dry.     Findings: No rash.  Neurological:     Mental Status: He is alert.     Cranial Nerves: Cranial nerve deficit: no gross deficits.     ED Results / Procedures / Treatments   Labs (all labs ordered are listed, but only abnormal results are displayed) Labs Reviewed - No data to display  EKG None  Radiology No results found.  Procedures Procedures (including critical care time)  Medications Ordered in ED Medications  tetracaine (PONTOCAINE) 0.5 % ophthalmic solution 2 drop (2 drops Right Eye Not Given 06/20/20 1022)  fluorescein ophthalmic strip 1 strip (1 strip Right Eye Not Given 06/20/20 1022)   tetracaine (PONTOCAINE) 0.5 % ophthalmic solution 2 drop (2 drops Both Eyes Given 06/20/20 1021)  fluorescein ophthalmic strip 1 strip (1 strip Both Eyes Given 06/20/20 1022)    ED Course  I have reviewed the triage vital signs and the nursing notes.  Pertinent labs & imaging results that were available during my care of the patient were reviewed by me and considered in my medical decision making (see chart for details).    MDM Rules/Calculators/A&P                          Patient with complaints of eye irritation after getting some type of powder into his eyes yesterday.  No evidence of erythema of the conjunctive a.  No fluorescein uptake to suggest corneal erosion or abrasion.  No evidence of persistent foreign body noted on exam.  Eyes irrigated in the ED.  Will discharge home with recommendations to use lubricating eyedrops.  Follow-up with Ortho if not better by end of the week Final Clinical Impression(s) / ED Diagnoses Final diagnoses:  Foreign body in eye, unspecified laterality, initial encounter    Rx / DC Orders ED Discharge Orders         Ordered    hydroxypropyl methylcellulose / hypromellose (ISOPTO TEARS / GONIOVISC) 2.5 % ophthalmic solution  4 times daily PRN        06/20/20 1046           Linwood Dibbles, MD 06/20/20 1046

## 2020-06-20 NOTE — ED Notes (Signed)
Pt refusing to have eyes flushed and is requesting to just leave. Provider aware. Pt provided with DC information. No questions at this time.

## 2020-06-22 ENCOUNTER — Other Ambulatory Visit: Payer: Self-pay

## 2020-06-22 ENCOUNTER — Ambulatory Visit (INDEPENDENT_AMBULATORY_CARE_PROVIDER_SITE_OTHER): Payer: HRSA Program

## 2020-06-22 ENCOUNTER — Encounter (HOSPITAL_COMMUNITY): Payer: Self-pay | Admitting: Emergency Medicine

## 2020-06-22 ENCOUNTER — Ambulatory Visit (HOSPITAL_COMMUNITY)
Admission: EM | Admit: 2020-06-22 | Discharge: 2020-06-22 | Disposition: A | Discharge status: Home / Self Care | Payer: HRSA Program | Attending: Emergency Medicine | Admitting: Emergency Medicine

## 2020-06-22 DIAGNOSIS — R059 Cough, unspecified: Secondary | ICD-10-CM

## 2020-06-22 DIAGNOSIS — R030 Elevated blood-pressure reading, without diagnosis of hypertension: Secondary | ICD-10-CM | POA: Insufficient documentation

## 2020-06-22 DIAGNOSIS — M545 Low back pain, unspecified: Secondary | ICD-10-CM

## 2020-06-22 DIAGNOSIS — Z20822 Contact with and (suspected) exposure to covid-19: Secondary | ICD-10-CM | POA: Insufficient documentation

## 2020-06-22 DIAGNOSIS — R042 Hemoptysis: Secondary | ICD-10-CM

## 2020-06-22 DIAGNOSIS — R0789 Other chest pain: Secondary | ICD-10-CM

## 2020-06-22 NOTE — Discharge Instructions (Addendum)
Your chest x-ray is negative.     Your COVID test is pending.  You should self quarantine until the test result is back.    Take Tylenol or ibuprofen as needed for fever or discomfort.  Rest and keep yourself hydrated.    Follow-up with your primary care provider if your symptoms are not improving.    Your blood pressure is elevated today at 159/104.  Please have this rechecked by your primary care provider in 2-4 weeks.

## 2020-06-22 NOTE — ED Provider Notes (Signed)
[facility]    CSN: [id number] Arrival date & time: 06/22/20  1351      History   Chief Complaint Chief Complaint  Patient presents with  . Cough  . Hemoptysis    HPI Nicholas Roth is a 59 y.o. male.   Patient presents with 1 to 2-week history of a cough productive of clear phlegm, runny nose, sneezing.  He states this morning he noted blood in the sputum.  He states he sometimes coughs so much that he vomits; no nausea or emesis other than with coughing.  He is a current every day smoker.  He denies fever, chills, sore throat, shortness of breath, diarrhea, or other symptoms.  He denies pertinent medical history.  He is unsure if he has had the COVID vaccines.  The history is provided by the patient.    History reviewed. No pertinent past medical history.  There are no problems to display for this patient.   History reviewed. No pertinent surgical history.     Home Medications    Prior to Admission medications   Medication Sig Start Date End Date Taking? Authorizing Provider  acetaminophen (TYLENOL) 500 MG tablet Take 500 mg by mouth every 6 (six) hours as needed for moderate pain.    [provider]  hydroxypropyl methylcellulose / hypromellose (ISOPTO TEARS / GONIOVISC) 2.5 % ophthalmic solution Place 1 drop into both eyes 4 (four) times daily as needed for up to 7 days for dry eyes. 06/20/20 06/27/20  Linwood Dibbles, MD  lidocaine (LIDODERM) 5 % Place 1 patch onto the skin daily. Remove & Discard patch within 12 hours or as directed by MD 10/15/19   Henderly, Britni A, PA-C  methocarbamol (ROBAXIN) 500 MG tablet Take 1 tablet (500 mg total) by mouth 2 (two) times daily. 10/15/19   Henderly, Britni A, PA-C    Family History Family History  Problem Relation Age of Onset  . CAD Father     Social History Social History   Tobacco Use  . Smoking status: Current Every Day Smoker  Substance Use Topics  . Alcohol use: Yes  . Drug use: Yes     Types: Marijuana, Cocaine     Allergies   Patient has no known allergies.   Review of Systems Review of Systems  Constitutional: Negative for chills and fever.  HENT: Positive for rhinorrhea and sneezing. Negative for ear pain and sore throat.   Eyes: Negative for pain and visual disturbance.  Respiratory: Positive for cough. Negative for shortness of breath.   Cardiovascular: Negative for chest pain and palpitations.  Gastrointestinal: Positive for vomiting. Negative for abdominal pain, diarrhea and nausea.  Genitourinary: Negative for dysuria and hematuria.  Musculoskeletal: Negative for arthralgias and back pain.  Skin: Negative for color change and rash.  Neurological: Negative for seizures and syncope.  All other systems reviewed and are negative.    Physical Exam Triage Vital Signs ED Triage Vitals  Enc Vitals Group     BP      Pulse      Resp      Temp      Temp src      SpO2      Weight      Height      Head Circumference      Peak Flow      Pain Score      Pain Loc      Pain Edu?      Excl. in  GC?    No data found.  Updated Vital Signs BP (!) 159/104 (BP Location: Right Arm)   Pulse 76   Temp 97.9 F (36.6 C) (Oral)   Resp 20   SpO2 100%   Visual Acuity Right Eye Distance:   Left Eye Distance:   Bilateral Distance:    Right Eye Near:   Left Eye Near:    Bilateral Near:     Physical Exam Vitals and nursing note reviewed.  Constitutional:      General: He is not in acute distress.    Appearance: He is well-developed and well-nourished. He is not ill-appearing.  HENT:     Head: Normocephalic and atraumatic.     Right Ear: Tympanic membrane normal.     Left Ear: Tympanic membrane normal.     Nose: Nose normal.     Mouth/Throat:     Mouth: Mucous membranes are moist.     Pharynx: Oropharynx is clear.  Eyes:     Conjunctiva/sclera: Conjunctivae normal.  Cardiovascular:     Rate and Rhythm: Normal rate and regular rhythm.     Heart  sounds: Normal heart sounds.  Pulmonary:     Effort: Pulmonary effort is normal. No respiratory distress.     Breath sounds: Normal breath sounds.  Abdominal:     Palpations: Abdomen is soft.     Tenderness: There is no abdominal tenderness. There is no guarding or rebound.  Musculoskeletal:        General: No edema.     Cervical back: Neck supple.  Skin:    General: Skin is warm and dry.     Findings: No rash.  Neurological:     General: No focal deficit present.     Mental Status: He is alert and oriented to person, place, and time.     Gait: Gait normal.  Psychiatric:        Mood and Affect: Mood and affect and mood normal.        Behavior: Behavior normal.      UC Treatments / Results  Labs (all labs ordered are listed, but only abnormal results are displayed) Labs Reviewed  SARS CORONAVIRUS 2 (TAT 6-24 HRS)    EKG   Radiology DG Chest 2 View  Result Date: 06/22/2020 CLINICAL DATA:  Right chest and back pain. Cough. Self reported hemoptysis. The EXAM: CHEST - 2 VIEW COMPARISON:  11/05/2011 FINDINGS: Mild levoconvex upper thoracic scoliosis, similar to prior. The lungs appear clear. Cardiac and mediastinal margins appear normal. No blunting of the costophrenic angles. No appreciable airway thickening. IMPRESSION: 1. No active cardiopulmonary disease is radiographically apparent. 2. Mild levoconvex upper thoracic scoliosis. Electronically Signed   By: Gaylyn Rong M.D.   On: 06/22/2020 16:52    Procedures Procedures (including critical care time)  Medications Ordered in UC Medications - No data to display  Initial Impression / Assessment and Plan / UC Course  I have reviewed the triage vital signs and the nursing notes.  Pertinent labs & imaging results that were available during my care of the patient were reviewed by me and considered in my medical decision making (see chart for details).   Cough. Elevated blood pressure reading. Chest x-ray negative. PCR  COVID pending. Instructed patient to self quarantine until the test result is back. Discussed symptomatic treatment. Instructed him to follow-up with his PCP if his symptoms are not improving. Discussed that his blood pressure is elevated today and needs to be rechecked by his  PCP in 2 to 4 weeks. Patient agrees to plan of care.   Final Clinical Impressions(s) / UC Diagnoses   Final diagnoses:  Cough  Elevated blood pressure reading     Discharge Instructions     Your chest x-ray is negative.     Your COVID test is pending.  You should self quarantine until the test result is back.    Take Tylenol or ibuprofen as needed for fever or discomfort.  Rest and keep yourself hydrated.    Follow-up with your primary care provider if your symptoms are not improving.    Your blood pressure is elevated today at 159/104.  Please have this rechecked by your primary care provider in 2-4 weeks.           ED Prescriptions    None     PDMP not reviewed this encounter.   Mickie Bail, NP 06/22/20 1704

## 2020-06-22 NOTE — ED Triage Notes (Signed)
Cough for 1 1/2 weeks.   Runny nose, sneezing since onset.   Patient describes coughing up dark blood this morning.  Patient reports one episode today.  Patient has pain in right chest side and right back.

## 2020-06-23 LAB — SARS CORONAVIRUS 2 (TAT 6-24 HRS): SARS Coronavirus 2: NEGATIVE

## 2020-09-10 ENCOUNTER — Other Ambulatory Visit: Payer: Self-pay

## 2020-09-10 ENCOUNTER — Encounter (HOSPITAL_COMMUNITY): Payer: Self-pay | Admitting: Emergency Medicine

## 2020-09-10 ENCOUNTER — Emergency Department (HOSPITAL_COMMUNITY)
Admission: EM | Admit: 2020-09-10 | Discharge: 2020-09-11 | Disposition: A | Discharge status: Home / Self Care | Payer: Self-pay | Attending: Emergency Medicine | Admitting: Emergency Medicine

## 2020-09-10 DIAGNOSIS — F17200 Nicotine dependence, unspecified, uncomplicated: Secondary | ICD-10-CM | POA: Insufficient documentation

## 2020-09-10 DIAGNOSIS — M542 Cervicalgia: Secondary | ICD-10-CM | POA: Insufficient documentation

## 2020-09-10 MED ORDER — PREDNISONE 20 MG PO TABS
40.0000 mg | ORAL_TABLET | Freq: Once | ORAL | Status: AC
  Administered 2020-09-11: 40 mg via ORAL
  Filled 2020-09-10: qty 2

## 2020-09-10 MED ORDER — LIDOCAINE 5 % EX PTCH
1.0000 | MEDICATED_PATCH | CUTANEOUS | Status: DC
  Administered 2020-09-10: 1 via TRANSDERMAL
  Filled 2020-09-10: qty 1

## 2020-09-10 MED ORDER — PREDNISONE 10 MG PO TABS: 40.0000 mg | ORAL_TABLET | Freq: Every day | ORAL | 0 refills | Status: AC

## 2020-09-10 MED ORDER — METHOCARBAMOL 500 MG PO TABS
500.0000 mg | ORAL_TABLET | Freq: Once | ORAL | Status: AC
  Administered 2020-09-11: 500 mg via ORAL
  Filled 2020-09-10: qty 1

## 2020-09-10 MED ORDER — METHOCARBAMOL 500 MG PO TABS: 500.0000 mg | ORAL_TABLET | Freq: Three times a day (TID) | ORAL | 0 refills | Status: DC | PRN

## 2020-09-10 MED ORDER — LIDOCAINE 5 % EX PTCH: 1.0000 | MEDICATED_PATCH | Freq: Every day | CUTANEOUS | 0 refills | Status: DC | PRN

## 2020-09-10 MED ORDER — ACETAMINOPHEN 500 MG PO TABS
1000.0000 mg | ORAL_TABLET | Freq: Once | ORAL | Status: AC
  Administered 2020-09-10: 1000 mg via ORAL
  Filled 2020-09-10: qty 2

## 2020-09-10 NOTE — ED Provider Notes (Signed)
[facility] EMERGENCY DEPARTMENT Provider Note   CSN: [id number] Arrival date & time: 09/10/20  2002     History Chief Complaint  Patient presents with  . Neck Pain    Nicholas Roth is a 60 y.o. male with a hx of tobacco abuse who presents to the ED with complaints of neck pain x 1 week. Patient reports pain is to the right side of his neck, constant, radiates down the RUE. Worse with movement. No alleviating factors. No recent trauma/change in activity. Denies numbness, tingling, weakness, saddle anesthesia, incontinence to bowel/bladder, fever, chills, IV drug use, dysuria, or hx of cancer. Patient has not had prior neck surgeries.   HPI     History reviewed. No pertinent past medical history.  There are no problems to display for this patient.   History reviewed. No pertinent surgical history.     Family History  Problem Relation Age of Onset  . CAD Father     Social History   Tobacco Use  . Smoking status: Current Every Day Smoker  Substance Use Topics  . Alcohol use: Yes  . Drug use: Yes    Types: Marijuana, Cocaine    Home Medications Prior to Admission medications   Medication Sig Start Date End Date Taking? Authorizing Provider  acetaminophen (TYLENOL) 500 MG tablet Take 500 mg by mouth every 6 (six) hours as needed for moderate pain.    [provider]  lidocaine (LIDODERM) 5 % Place 1 patch onto the skin daily. Remove & Discard patch within 12 hours or as directed by MD 10/15/19   Henderly, Britni A, PA-C  methocarbamol (ROBAXIN) 500 MG tablet Take 1 tablet (500 mg total) by mouth 2 (two) times daily. 10/15/19   Henderly, Britni A, PA-C    Allergies    Patient has no known allergies.  Review of Systems   Review of Systems  Constitutional: Negative for chills, fever and unexpected weight change.  Respiratory: Negative for shortness of breath.   Cardiovascular: Negative for chest pain.  Gastrointestinal: Negative for  abdominal pain, nausea and vomiting.  Genitourinary: Negative for dysuria.  Musculoskeletal: Positive for neck pain.  Neurological: Negative for weakness and numbness.       Negative for saddle anesthesia or bowel/bladder incontinence.   All other systems reviewed and are negative.   Physical Exam Updated Vital Signs BP 125/87 (BP Location: Left Arm)   Pulse 93   Temp 98 F (36.7 C) (Oral)   Resp 17   SpO2 97%   Physical Exam Vitals and nursing note reviewed.  Constitutional:      General: He is not in acute distress.    Appearance: Normal appearance. He is well-developed. He is not ill-appearing or toxic-appearing.  HENT:     Head: Normocephalic and atraumatic.  Eyes:     General:        Right eye: No discharge.        Left eye: No discharge.     Conjunctiva/sclera: Conjunctivae normal.  Neck:     Comments: ROM intact. No midline tenderness to palpation. Right paraspinal muscle tenderness most prominent over the trapezius muscle  Cardiovascular:     Rate and Rhythm: Normal rate and regular rhythm.     Pulses:          Radial pulses are 2+ on the right side and 2+ on the left side.  Pulmonary:     Effort: Pulmonary effort is normal. No respiratory distress.  Breath sounds: Normal breath sounds. No wheezing, rhonchi or rales.  Abdominal:     General: There is no distension.     Palpations: Abdomen is soft.     Tenderness: There is no abdominal tenderness.  Musculoskeletal:     Cervical back: Neck supple.     Comments: Upper extremities: No obvious deformity, appreciable swelling, edema, erythema, ecchymosis, warmth, or open wounds. Patient has intact AROM throughout with the exception in right shoulder flexion/abduction limited to about 90 degrees which patient reports is secondary to pain. Able to fully passively range. No focal bony tenderness.  Back: no midline tenderness.    Skin:    General: Skin is warm and dry.     Capillary Refill: Capillary refill takes  less than 2 seconds.     Findings: No rash.  Neurological:     Mental Status: He is alert.     Comments: Alert. Clear speech. Sensation grossly intact to bilateral upper extremities. 5/5 symmetric grip strength. Ambulatory. Able to perform OK sign, thumbs up, and cross 2nd/3rd digits bilaterally.   Psychiatric:        Mood and Affect: Mood normal.        Behavior: Behavior normal.    ED Results / Procedures / Treatments   Labs (all labs ordered are listed, but only abnormal results are displayed) Labs Reviewed - No data to display  EKG None  Radiology No results found.  Procedures Procedures   Medications Ordered in ED Medications  lidocaine (LIDODERM) 5 % 1 patch (1 patch Transdermal Patch Applied 09/10/20 2313)  acetaminophen (TYLENOL) tablet 1,000 mg (1,000 mg Oral Given 09/10/20 2314)    ED Course  I have reviewed the triage vital signs and the nursing notes.  Pertinent labs & imaging results that were available during my care of the patient were reviewed by me and considered in my medical decision making (see chart for details).    MDM Rules/Calculators/A&P                         Patient presents to the ED with complaints of neck pain x 1 week.  Nontoxic, vitals WNL.   Additional history obtained:  Additional history obtained from chart review & nursing note review.  C spine xray 01/2020: Multiple level degenerative change without acute abnormality.   No recent trauma, no midline spinal tenderness, no focal bony tenderness throughout the RUE- doubt fx/dislocation. No neuro deficits to raise concern for cord compression. Afebrile, no IVDU, no erythema/warmth, no rashes- does not seem consistent w/ epidural abscess, cellulitis, septic joint, or shingles. RUE well perfused. Suspect muscular strain/spasm with possible degree of radiculopathy. Will given prednisone burst, robaxin, & lidoderm patches. I discussed treatment plan, need for follow-up, and return precautions  with the patient. Provided opportunity for questions, patient confirmed understanding and is in agreement with plan.   Portions of this note were generated with Scientist, clinical (histocompatibility and immunogenetics). Dictation errors may occur despite best attempts at proofreading.  Final Clinical Impression(s) / ED Diagnoses Final diagnoses:  Neck pain    Rx / DC Orders ED Discharge Orders         Ordered    predniSONE (DELTASONE) 10 MG tablet  Daily        09/10/20 2244    methocarbamol (ROBAXIN) 500 MG tablet  Every 8 hours PRN        09/10/20 2244    lidocaine (LIDODERM) 5 %  Daily PRN  09/10/20 2244           Petrucelli, Pleas Koch, PA-C 09/10/20 2338    Melene Plan, DO 09/10/20 2341

## 2020-09-10 NOTE — Discharge Instructions (Addendum)
You were seen in the emergency department today for neck pain.  We suspect that your pain is related to muscular pain/strain/spasm with possible nerve irritation.  We are sending you home with the following medicines: -Lidoderm patch: Apply 1 patch to your area for significant pain once per day and remove within 12 hours.  -Prednisone: Take 1 tablet daily in the morning for the next 5 days.  Be sure to take with food, as it can cause stomach upset, and more seriously, stomach bleeding.  - Robaxin is the muscle relaxer I have prescribed, this is meant to help with muscle tightness, take every 8 hours as needed for pain. Be aware that this medication may make you drowsy therefore the first time you take this it should be at a time you are in an environment where you can rest. Do not drive or operate heavy machinery when taking this medication. Do not drink alcohol or take other sedating medications with this medicine such as narcotics or benzodiazepines.   You make take Tylenol per over the counter dosing with these medications.   We have prescribed you new medication(s) today. Discuss the medications prescribed today with your pharmacist as they can have adverse effects and interactions with your other medicines including over the counter and prescribed medications. Seek medical evaluation if you start to experience new or abnormal symptoms after taking one of these medicines, seek care immediately if you start to experience difficulty breathing, feeling of your throat closing, facial swelling, or rash as these could be indications of a more serious allergic reaction    Please apply heating pad to the area with significant pain.  Follow-up with primary care orthopedics within 1 week.  Return to the ER for new or worsening symptoms including but not limited to worsening pain, numbness, weakness, loss of control of bowel/bladder function, fever, rashes, or any other concerns.  If you have any difficulty  with medication affordability please take your medications into good [email or web address] for coupons.

## 2020-09-10 NOTE — ED Triage Notes (Signed)
Pt c/o neck pain x 1 week. Denies injury/trauma.

## 2020-09-10 NOTE — ED Notes (Signed)
Patient is resting comfortably. Pt does not have the means to fill prescriptions, SW consult placed

## 2020-09-26 ENCOUNTER — Other Ambulatory Visit: Payer: Self-pay

## 2020-09-26 ENCOUNTER — Encounter (HOSPITAL_COMMUNITY): Payer: Self-pay | Admitting: Emergency Medicine

## 2020-09-26 ENCOUNTER — Emergency Department (HOSPITAL_COMMUNITY)
Admission: EM | Admit: 2020-09-26 | Discharge: 2020-09-26 | Disposition: A | Discharge status: Home / Self Care | Payer: Self-pay | Attending: Emergency Medicine | Admitting: Emergency Medicine

## 2020-09-26 DIAGNOSIS — F17200 Nicotine dependence, unspecified, uncomplicated: Secondary | ICD-10-CM | POA: Insufficient documentation

## 2020-09-26 DIAGNOSIS — M542 Cervicalgia: Secondary | ICD-10-CM | POA: Insufficient documentation

## 2020-09-26 DIAGNOSIS — S46911A Strain of unspecified muscle, fascia and tendon at shoulder and upper arm level, right arm, initial encounter: Secondary | ICD-10-CM

## 2020-09-26 DIAGNOSIS — M25511 Pain in right shoulder: Secondary | ICD-10-CM | POA: Insufficient documentation

## 2020-09-26 MED ORDER — METAXALONE 800 MG PO TABS: 800.0000 mg | ORAL_TABLET | Freq: Three times a day (TID) | ORAL | 0 refills | Status: DC

## 2020-09-26 MED ORDER — LIDOCAINE 5 % EX PTCH: 1.0000 | MEDICATED_PATCH | Freq: Every day | CUTANEOUS | 0 refills | Status: DC | PRN

## 2020-09-26 NOTE — ED Notes (Signed)
Patient Alert and oriented to baseline. Stable and ambulatory to baseline. Patient verbalized understanding of the discharge instructions.  Patient belongings were taken by the patient.   

## 2020-09-26 NOTE — ED Triage Notes (Signed)
C/o neck pain, R shoulder pain, and R arm pain x 2 months.  Denies known injury.  States he had x-rays about 7 months ago for same and it got better and started again.

## 2020-09-26 NOTE — ED Provider Notes (Signed)
[facility] EMERGENCY DEPARTMENT Provider Note   CSN: [id number] Arrival date & time: 09/26/20  0809     History Chief Complaint  Patient presents with  . Neck Pain  . Shoulder Pain    Nicholas Roth is a 60 y.o. male.  60 year old male presents with persistent right upper shoulder and neck discomfort times several months.  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  Has been seen by this and told that he has some element of arthritis.  Pain is characterized as sharp and worse with movement.  No cardiac symptoms.  No distal numbness or tingling to his right hand.  No new treatments used prior to arrival.        History reviewed. No pertinent past medical history.  There are no problems to display for this patient.   History reviewed. No pertinent surgical history.     Family History  Problem Relation Age of Onset  . CAD Father     Social History   Tobacco Use  . Smoking status: Current Every Day Smoker  Substance Use Topics  . Alcohol use: Yes  . Drug use: Yes    Types: Marijuana, Cocaine    Home Medications Prior to Admission medications   Medication Sig Start Date End Date Taking? Authorizing Provider  acetaminophen (TYLENOL) 500 MG tablet Take 500 mg by mouth every 6 (six) hours as needed for moderate pain.    [provider]  lidocaine (LIDODERM) 5 % Place 1 patch onto the skin daily as needed. Apply patch to area most significant pain once per day.  Remove and discard patch within 12 hours of application. 09/10/20   Petrucelli, Samantha R, PA-C  methocarbamol (ROBAXIN) 500 MG tablet Take 1 tablet (500 mg total) by mouth every 8 (eight) hours as needed for muscle spasms. 09/10/20   Petrucelli, Pleas Koch, PA-C    Allergies    Patient has no known allergies.  Review of Systems   Review of Systems  All other systems reviewed and are negative.   Physical Exam Updated Vital  Signs BP (!) 137/110 (BP Location: Right Arm)   Pulse (!) 111   Temp 98.6 F (37 C)   Resp 18   SpO2 99%   Physical Exam Vitals and nursing note reviewed.  Constitutional:      General: He is not in acute distress.    Appearance: Normal appearance. He is well-developed. He is not toxic-appearing.  HENT:     Head: Normocephalic and atraumatic.  Eyes:     General: Lids are normal.     Conjunctiva/sclera: Conjunctivae normal.     Pupils: Pupils are equal, round, and reactive to light.  Neck:     Thyroid: No thyroid mass.     Trachea: No tracheal deviation.  Cardiovascular:     Rate and Rhythm: Normal rate and regular rhythm.     Heart sounds: Normal heart sounds. No murmur heard. No gallop.   Pulmonary:     Effort: Pulmonary effort is normal. No respiratory distress.     Breath sounds: Normal breath sounds. No stridor. No decreased breath sounds, wheezing, rhonchi or rales.  Abdominal:     General: Bowel sounds are normal. There is no distension.     Palpations: Abdomen is soft.     Tenderness: There is no abdominal tenderness. There is no rebound.  Musculoskeletal:        General: No tenderness. Normal range of motion.  Cervical back: Normal range of motion and neck supple.       Back:  Skin:    General: Skin is warm and dry.     Findings: No abrasion or rash.  Neurological:     Mental Status: He is alert and oriented to person, place, and time.     GCS: GCS eye subscore is 4. GCS verbal subscore is 5. GCS motor subscore is 6.     Cranial Nerves: No cranial nerve deficit.     Sensory: No sensory deficit.  Psychiatric:        Speech: Speech normal.        Behavior: Behavior normal.     ED Results / Procedures / Treatments   Labs (all labs ordered are listed, but only abnormal results are displayed) Labs Reviewed - No data to display  EKG None  Radiology No results found.  Procedures Procedures   Medications Ordered in ED Medications - No data to  display  ED Course  I have reviewed the triage vital signs and the nursing notes.  Pertinent labs & imaging results that were available during my care of the patient were reviewed by me and considered in my medical decision making (see chart for details).    MDM Rules/Calculators/A&P                          Patient tender along his trapezius muscle indicating likely muscle strain.  Will prescribe muscle relaxants as well as cold therapy and will discharge Final Clinical Impression(s) / ED Diagnoses Final diagnoses:  None    Rx / DC Orders ED Discharge Orders    None       Lorre Nick, MD 09/26/20 [phone]

## 2020-09-26 NOTE — Discharge Instructions (Addendum)
Follow-up with the occupational medicine's specialist associated with your place of employment.

## 2020-10-12 ENCOUNTER — Other Ambulatory Visit: Payer: Self-pay

## 2020-10-12 ENCOUNTER — Ambulatory Visit: Payer: Self-pay | Attending: Family Medicine | Admitting: Family Medicine

## 2020-10-12 ENCOUNTER — Other Ambulatory Visit: Payer: Self-pay | Admitting: Family Medicine

## 2020-10-12 ENCOUNTER — Encounter: Payer: Self-pay | Admitting: Family Medicine

## 2020-10-12 VITALS — BP 135/95 | HR 77 | Ht 68.0 in | Wt 163.4 lb

## 2020-10-12 DIAGNOSIS — M5412 Radiculopathy, cervical region: Secondary | ICD-10-CM

## 2020-10-12 DIAGNOSIS — F1010 Alcohol abuse, uncomplicated: Secondary | ICD-10-CM

## 2020-10-12 DIAGNOSIS — G5711 Meralgia paresthetica, right lower limb: Secondary | ICD-10-CM

## 2020-10-12 DIAGNOSIS — Z131 Encounter for screening for diabetes mellitus: Secondary | ICD-10-CM

## 2020-10-12 LAB — POCT GLYCOSYLATED HEMOGLOBIN (HGB A1C): Hemoglobin A1C: 6 % — AB (ref 4.0–5.6)

## 2020-10-12 MED ORDER — METAXALONE 800 MG PO TABS
800.0000 mg | ORAL_TABLET | Freq: Three times a day (TID) | ORAL | 3 refills | Status: DC
  Filled 2020-10-15 – 2021-08-31 (×3): qty 90, 30d supply, fill #0

## 2020-10-12 MED ORDER — MELOXICAM 7.5 MG PO TABS: 7.5000 mg | ORAL_TABLET | Freq: Every day | ORAL | 1 refills | Status: DC

## 2020-10-12 MED ORDER — GABAPENTIN 300 MG PO CAPS: 300.0000 mg | ORAL_CAPSULE | Freq: Two times a day (BID) | ORAL | 3 refills | Status: DC

## 2020-10-12 MED ORDER — METAXALONE 800 MG PO TABS: 800.0000 mg | ORAL_TABLET | Freq: Three times a day (TID) | ORAL | 3 refills | Status: DC

## 2020-10-12 NOTE — Progress Notes (Signed)
Tingling in leg and pain in neck

## 2020-10-12 NOTE — Progress Notes (Signed)
Acute Office Visit  Subjective:    Patient ID: Nicholas Roth, male    DOB: March 12, 1961, 60 y.o.   MRN: [id number]  Chief Complaint  Patient presents with  . Hospitalization Follow-up    HPI  Nicholas Roth is a 60 year old male with a PMH of upper extremity pain in the neck and shoulders here today with a chief complaint of tingling in in his leg and pain of his neck.   Recently, he had an ED visit on 3/15 for a strain of the right shoulder and was discharged with mataxolone, Tylenol, lidocaine patch and methocarbamol. The last imaging done of the cervical spine was an X-ray done in 7/21 which showed multilevel degenerative changes.   The pt reports no recent trauma.   In regards to his leg tingling, he says it began 3 months ago. He reports that he has a history of sciatica over 4 years ago. The tingling occurs on the anterior aspect of his thigh and does not radiate to any other area. He says there is no pain associated with it, just is uncomfortable. He says that he has experienced no mobility issues. He denies any back pain or shooting pain associated with this tingling.   His neck pain he rated a 7/10 today but reports that it has been very bothersome. He states it "feels like his head is too heavy for his neck and is smashing it down."  He feels that laying down and walking make the neck pain worse. He has a collar at home which he feels alleviates some of the pain. The pain begins in his neck and then shoots down his trapezius and then sometimes extends into his hands. He reports having issues with fine motor movements- he is unable to button his shirts or tie shoes. The patient is right handed and does not recall ever seeing an orthopedic.   He reports no dizziness, headaches or falls recently.   Pt reports drinking 2-3 8 oz drinks per day  POC HbA1c today was 6.0  History reviewed. No pertinent past medical history.  History reviewed. No pertinent surgical  history.  Family History  Problem Relation Age of Onset  . CAD Father     Social History   Socioeconomic History  . Marital status: Single    Spouse name: Not on file  . Number of children: Not on file  . Years of education: Not on file  . Highest education level: Not on file  Occupational History  . Not on file  Tobacco Use  . Smoking status: Current Every Day Smoker  . Smokeless tobacco: Not on file  Substance and Sexual Activity  . Alcohol use: Yes  . Drug use: Yes    Types: Marijuana, Cocaine  . Sexual activity: Not on file  Other Topics Concern  . Not on file  Social History Narrative  . Not on file   Social Determinants of Health   Financial Resource Strain: Not on file  Food Insecurity: Not on file  Transportation Needs: Not on file  Physical Activity: Not on file  Stress: Not on file  Social Connections: Not on file  Intimate Partner Violence: Not on file    Outpatient Medications Prior to Visit  Medication Sig Dispense Refill  . acetaminophen (TYLENOL) 500 MG tablet Take 500 mg by mouth every 6 (six) hours as needed for moderate pain.    Marland Kitchen lidocaine (LIDODERM) 5 % Place 1 patch onto the skin daily as  needed. Apply patch to area most significant pain once per day.  Remove and discard patch within 12 hours of application. 30 patch 0  . metaxalone (SKELAXIN) 800 MG tablet Take 1 tablet (800 mg total) by mouth 3 (three) times daily. 21 tablet 0  . methocarbamol (ROBAXIN) 500 MG tablet Take 1 tablet (500 mg total) by mouth every 8 (eight) hours as needed for muscle spasms. 15 tablet 0   No facility-administered medications prior to visit.    No Known Allergies  Review of Systems  Review of Systems  Constitutional: Negative for activity change, appetite change and fatigue.  HENT: Negative for congestion, sinus pressure and sore throat.   Eyes: Negative for visual disturbance.  Respiratory: Negative for cough, chest tightness, shortness of breath and  wheezing.   Cardiovascular: Negative for chest pain and palpitations.  Gastrointestinal: Negative for abdominal distention, abdominal pain and constipation.  Endocrine: Negative for polydipsia.  Genitourinary: Negative for dysuria and frequency.  Musculoskeletal: See HPI Skin: Negative for rash.  Neurological: Negative for tremors, light-headedness.   Hematological: Does not bruise/bleed easily.  Psychiatric/Behavioral: Negative for agitation and behavioral problems.       Objective:    Physical Exam  Constitutional:      General: He is not in acute distress.    Appearance: Normal appearance. He is well-developed. He is not toxic-appearing.  HENT:     Head: Normocephalic and atraumatic.  Eyes:     General: Lids are normal.     Conjunctiva/sclera: Conjunctivae normal.     Pupils: Pupils are equal, round, and reactive to light.  Neck:     Thyroid: No thyroid mass.     Trachea: No tracheal deviation.  Cardiovascular:     Rate and Rhythm: Normal rate and regular rhythm.     Heart sounds: Normal heart sounds. No murmur heard. No gallop.   Pulmonary:     Effort: Pulmonary effort is normal. No respiratory distress.     Breath sounds: Normal breath sounds. No stridor. No decreased breath sounds, wheezing, rhonchi or rales.  Abdominal:     General: Bowel sounds are normal. There is no distension.     Palpations: Abdomen is soft.     Tenderness: There is no abdominal tenderness. There is no rebound.  Musculoskeletal:     General:  Upon inspection, patient favors the L shoulder presenting with the R shoulder lower than the L Cervical:   Positive for rigidity of the neck in both side to side and flexion/extension motions  Pain to palpation below around C3 extending to the R side throughout the superior aspect of the   trapezius and into the shoulder   Shoulder/Arms:  Pain with resistance to flexion and extension of R arm, Normal on L  3+ strength on forward raise of arms on the R,  5+ on the L  Unable to abduct shoulder past 30 degrees on the R side, normal abduction/adduction on L   Pain with palpation of the R shoulder localized to the posterior aspect of the glenohumoral joint   and the [facility] joint  Unable to elicit bicep reflex in the R. 3+ reflex in the L  Unable to test tricep reflex due to pain elicited  3+ grip strength on the R, 5+ on the L Legs:   Normal ROM in extremities bilaterally  Negative straight leg raise bilaterally Skin:    General: Skin is warm and dry.  Neurological:     Mental Status: She is alert  and oriented to person, place, and time.  Psych: Normal affect    BP (!) 135/95   Pulse 77   Ht 5\' 8"  (1.727 m)   Wt 163 lb 6.4 oz (74.1 kg)   SpO2 98%   BMI 24.84 kg/m  Wt Readings from Last 3 Encounters:  10/12/20 163 lb 6.4 oz (74.1 kg)  06/20/20 160 lb 0.9 oz (72.6 kg)  01/23/20 160 lb (72.6 kg)    Health Maintenance Due  Topic Date Due  . Hepatitis C Screening  Never done  . COVID-19 Vaccine (1) Never done  . HIV Screening  Never done  . TETANUS/TDAP  Never done  . COLONOSCOPY (Pts 45-69yrs Insurance coverage will need to be confirmed)  Never done  . INFLUENZA VACCINE  Never done    There are no preventive care reminders to display for this patient.   No results found for: TSH Lab Results  Component Value Date   WBC 6.2 01/23/2020   HGB 14.2 01/23/2020   HCT 43.6 01/23/2020   MCV 96.9 01/23/2020   PLT 228 01/23/2020   Lab Results  Component Value Date   NA 138 01/23/2020   K 3.8 01/23/2020   CO2 25 01/23/2020   GLUCOSE 119 (H) 01/23/2020   BUN 11 01/23/2020   CREATININE 1.03 01/23/2020   BILITOT 0.7 01/23/2020   ALKPHOS 42 01/23/2020   AST 87 (H) 01/23/2020   ALT 79 (H) 01/23/2020   PROT 7.5 01/23/2020   ALBUMIN 3.2 (L) 01/23/2020   CALCIUM 8.7 (L) 01/23/2020   ANIONGAP 9 01/23/2020   No results found for: CHOL No results found for: HDL No results found for: LDLCALC No results found for: TRIG No results  found for: CHOLHDL  POC HbA1c today = 6.0     Assessment & Plan:   Problem List Items Addressed This Visit   None   Visit Diagnoses    Cervical radiculopathy    -  Primary Uncontrolled Ordered an MRI to see extent of degeneration of the cervical spine. Will consider orthopedics referral after MRI is completed.  Have added meloxicam an gabapentin to manage pain. Kidney function was assessed prior to initiating meloxicam.  Counseled patient to continue wearing the neck collar he has and to use warm compression and rest to help alleviate the pain.    Relevant Medications   metaxalone (SKELAXIN) 800 MG tablet   gabapentin (NEURONTIN) 300 MG capsule   meloxicam (MOBIC) 7.5 MG tablet   Other Relevant Orders   MR Cervical Spine Wo Contrast   Meralgia paresthetica of right side     Uncontrolled Counseled patient that this is a benign condition that should resolve spontaneously. Gabapentin added to pain regimen should provide some relief of the paresthesias he is experiencing.  Counseled patient on avoiding tight belts, waist bands and other garments to prevent flares.    Relevant Medications   metaxalone (SKELAXIN) 800 MG tablet   gabapentin (NEURONTIN) 300 MG capsule   Other Relevant Orders   Vitamin B12   Screening for diabetes mellitus     Wanting to r/o diabetes due to his reported paresthesia sx HbA1c today was 6.0  Counseled pt on eating healthy and regular exercise to prevent this from trending upwards and potentially developing diabetes   Alcohol abuse     Pt reports drinking 2-3 8 oz drinks per day.  Counseled patient that this can be contributing to his current nerve pain/paresthesias and should try to cut back as much as  he can.        Meds ordered this encounter  Medications  . metaxalone (SKELAXIN) 800 MG tablet    Sig: Take 1 tablet (800 mg total) by mouth 3 (three) times daily.    Dispense:  90 tablet    Refill:  3  . gabapentin (NEURONTIN) 300 MG capsule     Sig: Take 1 capsule (300 mg total) by mouth 2 (two) times daily.    Dispense:  60 capsule    Refill:  3  . meloxicam (MOBIC) 7.5 MG tablet    Sig: Take 1 tablet (7.5 mg total) by mouth daily.    Dispense:  30 tablet    Refill:  1     Jolyn Nap, Medical Student   Evaluation and management procedures were performed by me with Medical Student in attendance, note written by Medical Student under my supervision and collaboration. I have reviewed the note and I agree with the management and plan.  In summary Nicholas Roth presents with symptoms of cervical radiculopathy; we will need to exclude underlying cervical pathology by means of an MRI.  After which we can consider PT and possible orthopedic referral if indicated.  If his analgesic regimen is ineffective I will consider Cymbalta and possibly tramadol down the road.  He also has symptoms suspicious of meralgia paresthetica but will need to exclude alcoholic neuropathy as well as B12 deficiency.  Hopefully his new regimen should bring about some improvement in symptoms and he has been counseled on alcohol cessation.  Hoy Register, MD, FAAFP. [facility] [place], [place] [phone]   10/12/2020, 12:34 PM

## 2020-10-12 NOTE — Patient Instructions (Signed)

## 2020-10-13 ENCOUNTER — Telehealth: Payer: Self-pay

## 2020-10-13 LAB — VITAMIN B12: Vitamin B-12: 327 pg/mL (ref 232–1245)

## 2020-10-13 NOTE — Telephone Encounter (Signed)
-----   Message from Hoy Register, MD sent at 10/13/2020 10:54 AM EDT ----- Vitamin B12 is normal

## 2020-10-13 NOTE — Telephone Encounter (Signed)
Patient name and DOB has been verified Patient was informed of lab results. Patient had no questions.  

## 2020-10-14 ENCOUNTER — Other Ambulatory Visit: Payer: Self-pay

## 2020-10-15 ENCOUNTER — Other Ambulatory Visit: Payer: Self-pay

## 2020-10-15 MED FILL — Meloxicam Tab 7.5 MG: ORAL | 30 days supply | Qty: 30 | Fill #0 | Status: AC

## 2020-10-15 MED FILL — Gabapentin Cap 300 MG: ORAL | 30 days supply | Qty: 60 | Fill #0 | Status: AC

## 2020-10-16 ENCOUNTER — Other Ambulatory Visit: Payer: Self-pay

## 2020-10-21 ENCOUNTER — Encounter (HOSPITAL_COMMUNITY): Payer: Self-pay

## 2020-10-21 ENCOUNTER — Ambulatory Visit (HOSPITAL_COMMUNITY)
Admission: RE | Admit: 2020-10-21 | Discharge: 2020-10-21 | Disposition: A | Discharge status: Home / Self Care | Payer: Self-pay | Source: Ambulatory Visit | Attending: Family Medicine | Admitting: Family Medicine

## 2020-10-21 ENCOUNTER — Other Ambulatory Visit: Payer: Self-pay

## 2020-10-21 DIAGNOSIS — M5412 Radiculopathy, cervical region: Secondary | ICD-10-CM

## 2020-10-24 ENCOUNTER — Telehealth: Payer: Self-pay

## 2020-10-24 NOTE — Telephone Encounter (Signed)
Pt is needing medication to assist with scan.

## 2020-10-24 NOTE — Telephone Encounter (Signed)
Patient came by to let Dr. Alvis Lemmings know the MRI scan scheduled for 4/9 need to be reschedule. Patient was not able to complete the scan and will need to have an order to be sedated because he is claustrophobic. Please advise. Pt can be reached at [phone].

## 2020-10-25 MED ORDER — ALPRAZOLAM 1 MG PO TABS
ORAL_TABLET | ORAL | 0 refills | Status: AC
Start: 2020-10-25 — End: ?

## 2020-10-25 NOTE — Telephone Encounter (Signed)
Pt was called and a VM was left informing pt to return phone call. 

## 2020-10-25 NOTE — Telephone Encounter (Signed)
One dose of xanax prescribed sent to his [facility]  Take this prior to [facility]

## 2020-11-29 ENCOUNTER — Ambulatory Visit: Payer: Self-pay | Admitting: Family Medicine

## 2020-12-09 ENCOUNTER — Other Ambulatory Visit: Payer: Self-pay

## 2020-12-09 ENCOUNTER — Ambulatory Visit (HOSPITAL_COMMUNITY)
Admission: RE | Admit: 2020-12-09 | Discharge: 2020-12-09 | Disposition: A | Discharge status: Home / Self Care | Payer: Self-pay | Source: Ambulatory Visit | Attending: Family Medicine | Admitting: Family Medicine

## 2020-12-09 DIAGNOSIS — M5412 Radiculopathy, cervical region: Secondary | ICD-10-CM | POA: Insufficient documentation

## 2020-12-13 ENCOUNTER — Other Ambulatory Visit: Payer: Self-pay | Admitting: Family Medicine

## 2020-12-13 DIAGNOSIS — M5412 Radiculopathy, cervical region: Secondary | ICD-10-CM

## 2020-12-15 ENCOUNTER — Telehealth: Payer: Self-pay

## 2020-12-15 NOTE — Telephone Encounter (Signed)
Patient name and DOB has been verified Patient was informed of lab results. Patient had no questions.  

## 2020-12-15 NOTE — Telephone Encounter (Signed)
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

## 2021-01-03 ENCOUNTER — Ambulatory Visit (INDEPENDENT_AMBULATORY_CARE_PROVIDER_SITE_OTHER): Payer: Self-pay | Admitting: Orthopaedic Surgery

## 2021-01-03 DIAGNOSIS — M4802 Spinal stenosis, cervical region: Secondary | ICD-10-CM

## 2021-01-03 DIAGNOSIS — G9589 Other specified diseases of spinal cord: Secondary | ICD-10-CM

## 2021-01-03 HISTORY — DX: Spinal stenosis, cervical region: M48.02
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
## 2021-01-11 ENCOUNTER — Other Ambulatory Visit: Payer: Self-pay

## 2021-01-11 ENCOUNTER — Ambulatory Visit: Payer: Self-pay | Attending: Family Medicine | Admitting: Family Medicine

## 2021-01-11 ENCOUNTER — Encounter: Payer: Self-pay | Admitting: Family Medicine

## 2021-01-11 VITALS — BP 137/87 | HR 79 | Wt 168.2 lb

## 2021-01-11 DIAGNOSIS — M5412 Radiculopathy, cervical region: Secondary | ICD-10-CM

## 2021-01-11 DIAGNOSIS — Z1159 Encounter for screening for other viral diseases: Secondary | ICD-10-CM | POA: Insufficient documentation

## 2021-01-11 DIAGNOSIS — M4722 Other spondylosis with radiculopathy, cervical region: Secondary | ICD-10-CM | POA: Insufficient documentation

## 2021-01-11 DIAGNOSIS — F32A Depression, unspecified: Secondary | ICD-10-CM | POA: Insufficient documentation

## 2021-01-11 DIAGNOSIS — F419 Anxiety disorder, unspecified: Secondary | ICD-10-CM | POA: Insufficient documentation

## 2021-01-11 DIAGNOSIS — R0789 Other chest pain: Secondary | ICD-10-CM | POA: Insufficient documentation

## 2021-01-11 DIAGNOSIS — M4802 Spinal stenosis, cervical region: Secondary | ICD-10-CM | POA: Insufficient documentation

## 2021-01-11 DIAGNOSIS — Z79899 Other long term (current) drug therapy: Secondary | ICD-10-CM | POA: Insufficient documentation

## 2021-01-11 DIAGNOSIS — Z791 Long term (current) use of non-steroidal anti-inflammatories (NSAID): Secondary | ICD-10-CM | POA: Insufficient documentation

## 2021-01-11 DIAGNOSIS — Z720 Tobacco use: Secondary | ICD-10-CM

## 2021-01-11 MED ORDER — DULOXETINE HCL 60 MG PO CPEP
60.0000 mg | ORAL_CAPSULE | Freq: Every day | ORAL | 3 refills | Status: DC
  Filled 2021-01-11 – 2021-08-31 (×2): qty 30, 30d supply, fill #0

## 2021-01-11 MED ORDER — LIDOCAINE 5 % EX PTCH
1.0000 | MEDICATED_PATCH | Freq: Every day | CUTANEOUS | 3 refills | Status: DC | PRN
  Filled 2021-01-11: qty 30, 30d supply, fill #0
  Filled 2021-06-05 – 2021-06-15 (×2): qty 30, 30d supply, fill #1
  Filled 2021-08-27: qty 30, 30d supply, fill #0

## 2021-01-11 MED ORDER — NICOTINE 14 MG/24HR TD PT24
14.0000 mg | MEDICATED_PATCH | Freq: Every day | TRANSDERMAL | 2 refills | Status: AC
  Filled 2021-01-11: qty 28, 28d supply, fill #0

## 2021-01-11 NOTE — Progress Notes (Signed)
Subjective:  Patient ID: Nicholas Roth, male    DOB: 1961/06/22  Age: 59 y.o. MRN: [id number]  CC: Neck Pain   HPI Nicholas Roth is a 60 year old male with a history of cervical radiculopathy here for a follow up visit. MRI C-spine had revealed: IMPRESSION: 1. Degenerative changes of the cervical spine with multilevel high-grade neural foraminal narrowing, severe bilaterally at C6-7 and moderate bilaterally at C5-6. 2. No high-grade spinal canal stenosis. 3. Small T2 hyperintense lesion within the left posterolateral aspect of the cord at the C5-6 level may represent small demyelinating plaque. T2 hyperintense lesion at the C6-7 level seen only on STIR sagittal images without corresponding abnormality in the remainder sequences may be artifactual. Follow-up study without and with contrast is suggested.      Interval History: He was seen by Orthopedics, Dr Lorin Mercy and C5-6, C6-7 cervical fusion for stabilization recommended however the patients has no Medical insurance and is working on applying for financial assistance. He continues to experience numbness in his hands, decreased strength in hands and neck pain is severe. This causes him sleepless nights, anxiety and depression.  3 days ago he had chest pain in his left chest wall for which his Sister gave him some ASA. Pain has resolved. He continues to smoke but is interested in quitting. He is accompanied by a family member.  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 No pertinent surgical history.  Family History  Problem Relation Age of Onset   CAD Father     No Known Allergies  Outpatient Medications Prior to Visit  Medication Sig Dispense Refill   acetaminophen (TYLENOL) 500 MG tablet Take 500 mg by mouth every 6 (six) hours as needed for moderate pain.     gabapentin (NEURONTIN) 300 MG capsule TAKE 1 CAPSULE (300 MG TOTAL) BY MOUTH 2  (TWO) TIMES DAILY. 60 capsule 3   meloxicam (MOBIC) 7.5 MG tablet TAKE 1 TABLET (7.5 MG TOTAL) BY MOUTH DAILY. 30 tablet 1   metaxalone (SKELAXIN) 800 MG tablet Take 1 tablet (800 mg total) by mouth 3 (three) times daily. 90 tablet 3   gabapentin (NEURONTIN) 300 MG capsule Take 1 capsule (300 mg total) by mouth 2 (two) times daily. 60 capsule 3   lidocaine (LIDODERM) 5 % Place 1 patch onto the skin daily as needed. Apply patch to area most significant pain once per day.  Remove and discard patch within 12 hours of application. 30 patch 0   ALPRAZolam (XANAX) 1 MG tablet Take one tablet 30 min prior to MRI (Patient not taking: Reported on 01/11/2021) 1 tablet 0   meloxicam (MOBIC) 7.5 MG tablet Take 1 tablet (7.5 mg total) by mouth daily. 30 tablet 1   No facility-administered medications prior to visit.     ROS Review of Systems  Constitutional:  Negative for activity change and appetite change.  HENT:  Negative for sinus pressure and sore throat.   Eyes:  Negative for visual disturbance.  Respiratory:  Negative for cough, chest tightness and shortness of breath.   Cardiovascular:  Negative for chest pain and leg swelling.  Gastrointestinal:  Negative for abdominal distention, abdominal pain, constipation and diarrhea.  Endocrine: Negative.   Genitourinary:  Negative for dysuria.  Musculoskeletal:  Positive for neck pain. Negative for joint swelling and myalgias.  Skin:  Negative for rash.  Allergic/Immunologic: Negative.   Neurological:  Positive for weakness and numbness. Negative  for light-headedness.  Psychiatric/Behavioral:  Negative for dysphoric mood and suicidal ideas.    Objective:  BP 137/87   Pulse 79   Wt 168 lb 3.2 oz (76.3 kg)   SpO2 99%   BMI 25.57 kg/m   BP/Weight 01/11/2021 01/03/2021 5/70/1779  Systolic BP 390 300 923  Diastolic BP 87 85 95  Wt. (Lbs) 168.2 165 163.4  BMI 25.57 25.09 24.84      Physical Exam Constitutional:      Appearance: He is  well-developed.  Neck:     Vascular: No JVD.  Cardiovascular:     Rate and Rhythm: Normal rate.     Heart sounds: Normal heart sounds. No murmur heard. Pulmonary:     Effort: Pulmonary effort is normal.     Breath sounds: Normal breath sounds. No wheezing or rales.  Chest:     Chest wall: No tenderness.  Abdominal:     General: Bowel sounds are normal. There is no distension.     Palpations: Abdomen is soft. There is no mass.     Tenderness: There is no abdominal tenderness.  Musculoskeletal:        General: Normal range of motion.     Cervical back: Tenderness (TTP of L side of neck) present.     Right lower leg: No edema.     Left lower leg: No edema.  Neurological:     Mental Status: He is alert and oriented to person, place, and time.     Comments: Reduced hand grip b/l  Psychiatric:        Mood and Affect: Mood normal.    CMP Latest Ref Rng & Units 01/23/2020 01/18/2020  Glucose 70 - 99 mg/dL 119(H) 102(H)  BUN 6 - 20 mg/dL 11 5(L)  Creatinine 0.61 - 1.24 mg/dL 1.03 0.97  Sodium 135 - 145 mmol/L 138 137  Potassium 3.5 - 5.1 mmol/L 3.8 3.2(L)  Chloride 98 - 111 mmol/L 104 102  CO2 22 - 32 mmol/L 25 23  Calcium 8.9 - 10.3 mg/dL 8.7(L) 8.5(L)  Total Protein 6.5 - 8.1 g/dL 7.5 -  Total Bilirubin 0.3 - 1.2 mg/dL 0.7 -  Alkaline Phos 38 - 126 U/L 42 -  AST 15 - 41 U/L 87(H) -  ALT 0 - 44 U/L 79(H) -    Lipid Panel  No results found for: CHOL, TRIG, HDL, CHOLHDL, VLDL, LDLCALC, LDLDIRECT  CBC    Component Value Date/Time   WBC 6.2 01/23/2020 0939   RBC 4.50 01/23/2020 0939   HGB 14.2 01/23/2020 0939   HCT 43.6 01/23/2020 0939   PLT 228 01/23/2020 0939   MCV 96.9 01/23/2020 0939   MCH 31.6 01/23/2020 0939   MCHC 32.6 01/23/2020 0939   RDW 14.6 01/23/2020 0939   LYMPHSABS 2.4 01/23/2020 0939   MONOABS 1.1 (H) 01/23/2020 0939   EOSABS 0.1 01/23/2020 0939   BASOSABS 0.0 01/23/2020 0939    Lab Results  Component Value Date   HGBA1C 6.0 (A) 10/12/2020     Assessment & Plan:  1. Anxiety and depression Likely due to underlying pain Elevated PHQ9, GAD7 Cymbalta initiated - DULoxetine (CYMBALTA) 60 MG capsule; Take 1 capsule (60 mg total) by mouth daily.  Dispense: 30 capsule; Refill: 3  2. Cervical radiculopathy Uncontrolled Needs to undergo cervical fusion Cymbalta and Lidoderm patches added - DULoxetine (CYMBALTA) 60 MG capsule; Take 1 capsule (60 mg total) by mouth daily.  Dispense: 30 capsule; Refill: 3 - lidocaine (LIDODERM) 5 %; Place 1  patch onto the skin daily as needed. Apply patch to area most significant pain once per day.  Remove and discard patch within 12 hours of application.  Dispense: 30 patch; Refill: 3  3. Other chest pain Absent at this time EKG reassuring - CMP14+EGFR - CBC with Differential/Platelet  4. Tobacco abuse Spent 3 minutes counseling on smoking cessation and he is interested in quitting - nicotine (NICODERM CQ) 14 mg/24hr patch; Place 1 patch (14 mg total) onto the skin daily.  Dispense: 28 patch; Refill: 2  5. Foraminal stenosis of cervical region See #2 above - lidocaine (LIDODERM) 5 %; Place 1 patch onto the skin daily as needed. Apply patch to area most significant pain once per day.  Remove and discard patch within 12 hours of application.  Dispense: 30 patch; Refill: 3  6. Screening for viral disease - HCV RNA quant rflx ultra or genotyp([facility])   Meds ordered this encounter  Medications   DULoxetine (CYMBALTA) 60 MG capsule    Sig: Take 1 capsule (60 mg total) by mouth daily.    Dispense:  30 capsule    Refill:  3   lidocaine (LIDODERM) 5 %    Sig: Place 1 patch onto the skin daily as needed. Apply patch to area most significant pain once per day.  Remove and discard patch within 12 hours of application.    Dispense:  30 patch    Refill:  3   nicotine (NICODERM CQ) 14 mg/24hr patch    Sig: Place 1 patch (14 mg total) onto the skin daily.    Dispense:  28 patch    Refill:   2    Follow-up: Return in about 3 months (around 04/13/2021) for Medical conditions.       Charlott Rakes, MD, FAAFP. [facility] [place], [place]   01/11/2021, 9:23 AM

## 2021-01-11 NOTE — Patient Instructions (Signed)
Steps to Quit Smoking Smoking tobacco is the leading cause of preventable death. It can affect almost every organ in the body. Smoking puts you and people around you at risk for many serious, long-lasting (chronic) diseases. Quitting smoking can be hard, but it is one of the best things that you can do for your health. It is never too late to quit. How do I get ready to quit? When you decide to quit smoking, make a plan to help you succeed. Before you quit: Pick a date to quit. Set a date within the next 2 weeks to give you time to prepare. Write down the reasons why you are quitting. Keep this list in places where you will see it often. Tell your family, friends, and co-workers that you are quitting. Their support is important. Talk with your doctor about the choices that may help you quit. Find out if your health insurance will pay for these treatments. Know the people, places, things, and activities that make you want to smoke (triggers). Avoid them. What first steps can I take to quit smoking? Throw away all cigarettes at home, at work, and in your car. Throw away the things that you use when you smoke, such as ashtrays and lighters. Clean your car. Make sure to empty the ashtray. Clean your home, including curtains and carpets. What can I do to help me quit smoking? Talk with your doctor about taking medicines and seeing a counselor at the same time. You are more likely to succeed when you do both. If you are pregnant or breastfeeding, talk with your doctor about counseling or other ways to quit smoking. Do not take medicine to help you quit smoking unless your doctor tells you to do so. To quit smoking: Quit right away Quit smoking totally, instead of slowly cutting back on how much you smoke over a period of time. Go to counseling. You are more likely to quit if you go to counseling sessions regularly. Take medicine You may take medicines to help you quit. Some medicines need a  prescription, and some you can buy over-the-counter. Some medicines may contain a drug called nicotine to replace the nicotine in cigarettes. Medicines may: Help you to stop having the desire to smoke (cravings). Help to stop the problems that come when you stop smoking (withdrawal symptoms). Your doctor may ask you to use: Nicotine patches, gum, or lozenges. Nicotine inhalers or sprays. Non-nicotine medicine that is taken by mouth. Find resources Find resources and other ways to help you quit smoking and remain smoke-free after you quit. These resources are most helpful when you use them often. They include: Online chats with a counselor. Phone quitlines. Printed self-help materials. Support groups or group counseling. Text messaging programs. Mobile phone apps. Use apps on your mobile phone or tablet that can help you stick to your quit plan. There are many free apps for mobile phones and tablets as well as websites. Examples include Quit Guide from the CDC and smokefree.gov  What things can I do to make it easier to quit?  Talk to your family and friends. Ask them to support and encourage you. Call a phone quitline (1-800-QUIT-NOW), reach out to support groups, or work with a counselor. Ask people who smoke to not smoke around you. Avoid places that make you want to smoke, such as: Bars. Parties. Smoke-break areas at work. Spend time with people who do not smoke. Lower the stress in your life. Stress can make you want to   smoke. Try these things to help your stress: Getting regular exercise. Doing deep-breathing exercises. Doing yoga. Meditating. Doing a body scan. To do this, close your eyes, focus on one area of your body at a time from head to toe. Notice which parts of your body are tense. Try to relax the muscles in those areas. How will I feel when I quit smoking? Day 1 to 3 weeks Within the first 24 hours, you may start to have some problems that come from quitting tobacco.  These problems are very bad 2-3 days after you quit, but they do not often last for more than 2-3 weeks. You may get these symptoms: Mood swings. Feeling restless, nervous, angry, or annoyed. Trouble concentrating. Dizziness. Strong desire for high-sugar foods and nicotine. Weight gain. Trouble pooping (constipation). Feeling like you may vomit (nausea). Coughing or a sore throat. Changes in how the medicines that you take for other issues work in your body. Depression. Trouble sleeping (insomnia). Week 3 and afterward After the first 2-3 weeks of quitting, you may start to notice more positive results, such as: Better sense of smell and taste. Less coughing and sore throat. Slower heart rate. Lower blood pressure. Clearer skin. Better breathing. Fewer sick days. Quitting smoking can be hard. Do not give up if you fail the first time. Some people need to try a few times before they succeed. Do your best to stick to your quit plan, and talk with your doctor if you have any questions or concerns. Summary Smoking tobacco is the leading cause of preventable death. Quitting smoking can be hard, but it is one of the best things that you can do for your health. When you decide to quit smoking, make a plan to help you succeed. Quit smoking right away, not slowly over a period of time. When you start quitting, seek help from your doctor, family, or friends. This information is not intended to replace advice given to you by your health care provider. Make sure you discuss any questions you have with your health care provider. Document Revised: 03/26/2019 Document Reviewed: 09/19/2018 Elsevier Patient Education  2022 Elsevier Inc.  

## 2021-01-11 NOTE — Progress Notes (Signed)
States that pain in neck and left arm is getting worst.  Medication refills.

## 2021-01-12 ENCOUNTER — Telehealth: Payer: Self-pay

## 2021-01-12 ENCOUNTER — Other Ambulatory Visit: Payer: Self-pay

## 2021-01-12 NOTE — Telephone Encounter (Signed)
Patient name and DOB has been verified Patient was informed of lab results. Patient had no questions.  

## 2021-01-12 NOTE — Telephone Encounter (Signed)
-----   Message from Hoy Register, MD sent at 01/12/2021  9:19 AM EDT ----- Labs are stable with normal kidney functions but liver enzymes are slightly elevated but improved compared to previous.  If he consumes alcohol, please advise to avoid.  Thanks

## 2021-01-14 LAB — CMP14+EGFR
ALT: 61 IU/L — ABNORMAL HIGH (ref 0–44)
AST: 61 IU/L — ABNORMAL HIGH (ref 0–40)
Albumin/Globulin Ratio: 1.1 — ABNORMAL LOW (ref 1.2–2.2)
Albumin: 3.8 g/dL (ref 3.8–4.9)
Alkaline Phosphatase: 47 IU/L (ref 44–121)
BUN/Creatinine Ratio: 13 (ref 9–20)
BUN: 12 mg/dL (ref 6–24)
Bilirubin Total: 0.3 mg/dL (ref 0.0–1.2)
CO2: 23 mmol/L (ref 20–29)
Calcium: 8.8 mg/dL (ref 8.7–10.2)
Chloride: 103 mmol/L (ref 96–106)
Creatinine, Ser: 0.95 mg/dL (ref 0.76–1.27)
Globulin, Total: 3.6 g/dL (ref 1.5–4.5)
Glucose: 88 mg/dL (ref 65–99)
Potassium: 4.1 mmol/L (ref 3.5–5.2)
Sodium: 143 mmol/L (ref 134–144)
Total Protein: 7.4 g/dL (ref 6.0–8.5)
eGFR: 93 mL/min/{1.73_m2} (ref >59)

## 2021-01-14 LAB — CBC WITH DIFFERENTIAL/PLATELET
Basophils Absolute: 0 10*3/uL (ref 0.0–0.2)
Basos: 1 %
EOS (ABSOLUTE): 0.1 10*3/uL (ref 0.0–0.4)
Eos: 2 %
Hematocrit: 40.4 % (ref 37.5–51.0)
Hemoglobin: 13.8 g/dL (ref 13.0–17.7)
Immature Grans (Abs): 0 10*3/uL (ref 0.0–0.1)
Immature Granulocytes: 0 %
Lymphocytes Absolute: 2.9 10*3/uL (ref 0.7–3.1)
Lymphs: 53 %
MCH: 32.2 pg (ref 26.6–33.0)
MCHC: 34.2 g/dL (ref 31.5–35.7)
MCV: 94 fL (ref 79–97)
Monocytes Absolute: 0.8 10*3/uL (ref 0.1–0.9)
Monocytes: 16 %
Neutrophils Absolute: 1.5 10*3/uL (ref 1.4–7.0)
Neutrophils: 28 %
Platelets: 225 10*3/uL (ref 150–450)
RBC: 4.29 x10E6/uL (ref 4.14–5.80)
RDW: 13.7 % (ref 11.6–15.4)
WBC: 5.4 10*3/uL (ref 3.4–10.8)

## 2021-01-14 LAB — HCV RNA QUANT RFLX ULTRA OR GENOTYP
HCV Quant Baseline: 2400000 IU/mL
HCV log10: 6.38 log10 IU/mL

## 2021-01-14 LAB — HEPATITIS C GENOTYPE

## 2021-01-16 ENCOUNTER — Other Ambulatory Visit: Payer: Self-pay

## 2021-01-16 ENCOUNTER — Ambulatory Visit: Payer: Self-pay | Attending: Family Medicine

## 2021-01-18 ENCOUNTER — Telehealth: Payer: Self-pay | Admitting: Family Medicine

## 2021-01-18 ENCOUNTER — Telehealth: Payer: Self-pay

## 2021-01-18 NOTE — Telephone Encounter (Signed)
Returned a phone call to the patient. LVM to contact [place] at [facility].

## 2021-01-18 NOTE — Telephone Encounter (Signed)
Following up on a request from the patient for assistance with the current living situation. LVM for the patient to call CM at [facility].

## 2021-01-18 NOTE — Telephone Encounter (Signed)
Copied from CRM [id number]. Topic: General - Other >> Jan 10, 2021  2:49 PM Gaetana Michaelis A wrote: Reason for CRM: Patient would like to be contacted regarding financial counseling  Patient is interested in assistance programs that could help their current living situation   Please contact to further advise

## 2021-01-19 NOTE — Telephone Encounter (Signed)
Patient called back regarding living situation. Please call back

## 2021-01-26 ENCOUNTER — Other Ambulatory Visit: Payer: Self-pay

## 2021-01-26 MED FILL — Meloxicam Tab 7.5 MG: ORAL | 30 days supply | Qty: 30 | Fill #1 | Status: CN

## 2021-02-02 ENCOUNTER — Other Ambulatory Visit: Payer: Self-pay

## 2021-02-06 ENCOUNTER — Telehealth: Payer: Self-pay | Admitting: Family Medicine

## 2021-02-06 NOTE — Telephone Encounter (Signed)
Pt was sent a letter from financial dept. Inform them, that the application they submitted was incomplete, since they were missing some documentation at the time of the appointment, Pt need to reschedule and resubmit all new papers and application for CAFA and [place], P.S. old documents has been sent back by mail to the Pt and Pt. need to make a new appt. 

## 2021-02-14 ENCOUNTER — Other Ambulatory Visit: Payer: Self-pay

## 2021-02-14 ENCOUNTER — Ambulatory Visit: Payer: Self-pay | Attending: Family Medicine

## 2021-02-19 NOTE — Telephone Encounter (Signed)
Patient called in checking status of financial paperwork he dropped off yesterday. Please call back

## 2021-02-21 ENCOUNTER — Telehealth: Payer: Self-pay | Admitting: Family Medicine

## 2021-02-21 NOTE — Telephone Encounter (Signed)
Copied from CRM [id number]. Topic: General - Other >> Feb 19, 2021 11:15 AM Gaetana Michaelis A wrote: Reason for CRM: Patient's significant other would like to be contacted by Mr. Mikle Bosworth to further discuss patient's financial assistance applications  There is also an issue with who the account holder is of one of the patient's bank accounts and Ms Su Hilt would like to provide clarity on that issue  Please contact further when possible

## 2021-02-21 NOTE — Telephone Encounter (Signed)
I call back the Pt, LVM inform him that still missing the back statement that show on his taxes, need the last 3 month, May, June and July all the pages from these statement

## 2021-02-22 NOTE — Telephone Encounter (Signed)
I called back the Pt, Nicholas Roth again, explaining that still missing the bank statement to complete his CAFA application

## 2021-02-27 ENCOUNTER — Telehealth: Payer: Self-pay | Admitting: Family Medicine

## 2021-02-27 NOTE — Telephone Encounter (Signed)
Pt was sent a letter from financial dept. Inform them, that the application they submitted was incomplete, since they were missing some documentation at the time of the appointment, Pt need to reschedule and resubmit all new papers and application for CAFA and [place], P.S. old documents has been sent back by mail to the Pt and Pt. need to make a new appt. 

## 2021-03-15 ENCOUNTER — Other Ambulatory Visit: Payer: Self-pay

## 2021-03-15 ENCOUNTER — Ambulatory Visit: Payer: Self-pay | Attending: Family Medicine

## 2021-04-11 ENCOUNTER — Ambulatory Visit: Payer: Self-pay | Attending: Family Medicine | Admitting: Family Medicine

## 2021-04-11 ENCOUNTER — Other Ambulatory Visit: Payer: Self-pay

## 2021-04-11 VITALS — BP 147/98 | HR 57 | Ht 68.0 in | Wt 161.4 lb

## 2021-04-11 DIAGNOSIS — G8929 Other chronic pain: Secondary | ICD-10-CM

## 2021-04-11 DIAGNOSIS — M25561 Pain in right knee: Secondary | ICD-10-CM

## 2021-04-11 DIAGNOSIS — M5412 Radiculopathy, cervical region: Secondary | ICD-10-CM

## 2021-04-11 NOTE — Progress Notes (Signed)
Subjective:  Patient ID: Nicholas Roth, male    DOB: 1961-04-05  Age: 60 y.o. MRN: [id number]  CC: Neck Pain   HPI Nicholas Roth is a 60 y.o. year old male with a history of cervical radiculopathy, prediabetes (A1c 6.0) here for a follow up visit.   Interval History: He complains he was having tremors and had to discontinue all his medications as he was unure of which medication caused it. He is yet to obtain CAFA and has been unable to see a Specialist for his cervical radiculopathy as he has no insurance. He has numbness in his L hand. He has pain on the R side of his neck. 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  Family History  Problem Relation Age of Onset   CAD Father     No Known Allergies  Outpatient Medications Prior to Visit  Medication Sig Dispense Refill   acetaminophen (TYLENOL) 500 MG tablet Take 500 mg by mouth every 6 (six) hours as needed for moderate pain. (Patient not taking: Reported on 04/11/2021)     ALPRAZolam (XANAX) 1 MG tablet Take one tablet 30 min prior to MRI (Patient not taking: No sig reported) 1 tablet 0   DULoxetine (CYMBALTA) 60 MG capsule Take 1 capsule (60 mg total) by mouth daily. (Patient not taking: Reported on 04/11/2021) 30 capsule 3   gabapentin (NEURONTIN) 300 MG capsule TAKE 1 CAPSULE (300 MG TOTAL) BY MOUTH 2 (TWO) TIMES DAILY. (Patient not taking: Reported on 04/11/2021) 60 capsule 3   lidocaine (LIDODERM) 5 % Place 1 patch onto the skin daily as needed. Apply patch to area most significant pain once per day.  Remove and discard patch within 12 hours of application. (Patient not taking: Reported on 04/11/2021) 30 patch 3   meloxicam (MOBIC) 7.5 MG tablet TAKE 1 TABLET (7.5 MG TOTAL) BY MOUTH DAILY. (Patient not taking: Reported on  04/11/2021) 30 tablet 1   metaxalone (SKELAXIN) 800 MG tablet Take 1 tablet (800 mg total) by mouth 3 (three) times daily. (Patient not taking: Reported on 04/11/2021) 90 tablet 3   nicotine (NICODERM CQ) 14 mg/24hr patch Place 1 patch (14 mg total) onto the skin daily. (Patient not taking: Reported on 04/11/2021) 28 patch 2   No facility-administered medications prior to visit.     ROS Review of Systems  Constitutional:  Negative for activity change and appetite change.  HENT:  Negative for sinus pressure and sore throat.   Eyes:  Negative for visual disturbance.  Respiratory:  Negative for cough, chest tightness and shortness of breath.   Cardiovascular:  Negative for chest pain and leg swelling.  Gastrointestinal:  Negative for abdominal distention, abdominal pain, constipation and diarrhea.  Endocrine: Negative.   Genitourinary:  Negative for dysuria.  Musculoskeletal:  Positive for gait problem and neck pain. Negative for joint swelling and myalgias.  Skin:  Negative for rash.  Allergic/Immunologic: Negative.   Neurological:  Negative for weakness, light-headedness and numbness.  Psychiatric/Behavioral:  Negative for dysphoric mood and suicidal ideas.    Objective:  BP (!) 147/98   Pulse (!) 57   Ht 5\' 8"  (1.727 m)   Wt 161 lb 6.4 oz (73.2 kg)   SpO2 100%   BMI 24.54 kg/m  BP/Weight 04/11/2021 01/11/2021 01/03/2021  Systolic BP 147 137 122  Diastolic BP 98 87 85  Wt. (Lbs) 161.4 168.2 165  BMI 24.54 25.57 25.09      Physical Exam Constitutional:      Appearance: He is well-developed.  Neck:     Comments: Decreased lateral rotation of neck Cardiovascular:     Rate and Rhythm: Bradycardia present.     Heart sounds: Normal heart sounds. No murmur heard. Pulmonary:     Effort: Pulmonary effort is normal.     Breath sounds: Normal breath sounds. No wheezing or rales.  Chest:     Chest wall: No tenderness.  Abdominal:     General: Bowel sounds are normal. There is no  distension.     Palpations: Abdomen is soft. There is no mass.     Tenderness: There is no abdominal tenderness.  Musculoskeletal:        General: Normal range of motion.     Cervical back: Tenderness (R side of neck) present.     Right lower leg: No edema.     Left lower leg: No edema.  Skin:    Comments: Tenderness to palpation of right popliteal fossa on the lateral aspect and on range of motion  Neurological:     Mental Status: He is alert and oriented to person, place, and time.     Gait: Gait abnormal (ambulates with a limp).     Comments: Reduced handgrip on the right, left hand grip is normal  Psychiatric:        Mood and Affect: Mood normal.    CMP Latest Ref Rng & Units 01/11/2021 01/23/2020 01/18/2020  Glucose 65 - 99 mg/dL 88 315(V) 761(Y)  BUN 6 - 24 mg/dL 12 11 5(L)  Creatinine 0.76 - 1.27 mg/dL 0.73 7.10 6.26  Sodium 134 - 144 mmol/L 143 138 137  Potassium 3.5 - 5.2 mmol/L 4.1 3.8 3.2(L)  Chloride 96 - 106 mmol/L 103 104 102  CO2 20 - 29 mmol/L 23 25 23   Calcium 8.7 - 10.2 mg/dL 8.8 ) 9.4(W)  Total Protein 6.0 - 8.5 g/dL 7.4 7.5 -  Total Bilirubin 0.0 - 1.2 mg/dL 0.3 0.7 -  Alkaline Phos 44 - 121 IU/L 47 42 -  AST 0 - 40 IU/L 61(H) 87(H) -  ALT 0 - 44 IU/L 61(H) 79(H) -    Lipid Panel  No results found for: CHOL, TRIG, HDL, CHOLHDL, VLDL, LDLCALC, LDLDIRECT  CBC    Component Value Date/Time   WBC 5.4 01/11/2021 1002   WBC 6.2 01/23/2020 0939   RBC 4.29 01/11/2021 1002   RBC 4.50 01/23/2020 0939   HGB 13.8 01/11/2021 1002   HCT 40.4 01/11/2021 1002   PLT 225 01/11/2021 1002   MCV 94 01/11/2021 1002   MCH 32.2 01/11/2021 1002   MCH 31.6 01/23/2020 0939   MCHC 34.2 01/11/2021 1002   MCHC 32.6 01/23/2020 0939   RDW 13.7 01/11/2021 1002   LYMPHSABS 2.9 01/11/2021 1002   MONOABS 1.1 (H) 01/23/2020 0939   EOSABS 0.1 01/11/2021 1002   BASOSABS 0.0 01/11/2021 1002    Lab Results  Component Value Date   HGBA1C 6.0 (A) 10/12/2020    Assessment &  Plan:  1. Cervical radiculopathy Uncontrolled He has been without all his medications Advised to resume medications one at a time in order to identify the culprit with regards to his tremors He has been advised to apply for the [employer] financial discount to facilitate  referral to physical therapy and spine specialist if indicated  2. Chronic pain of right knee He does have refills for meloxicam and has been advised to pick this up from the pharmacy Use right knee brace    No orders of the defined types were placed in this encounter.   Return in about 3 months (around 07/11/2021) for Neck pain.       Hoy Register, MD, FAAFP. [facility] [place], [place] [phone]   04/11/2021, 10:00 AM

## 2021-04-11 NOTE — Progress Notes (Signed)
Pt states that he is having pain on the right side of his body.  Stopped all medications due to his body shaking when he took the medications.

## 2021-04-11 NOTE — Patient Instructions (Signed)
Cervical Radiculopathy  Cervical radiculopathy means that a nerve in the neck (a cervical nerve) is pinched or bruised. This can happen because of an injury to the cervical spine (vertebrae) in the neck, or as a normal part of getting older. This can cause pain or loss of feeling (numbness) that runs from your neck all the way down to your arm and fingers. Often, this condition gets better with rest. Treatment may be needed if the conditiondoes not get better. What are the causes? A neck injury. A bulging disk in your spine. Muscle movements that you cannot control (muscle spasms). Tight muscles in your neck due to overuse. Arthritis. Breakdown in the bones and joints of the spine (spondylosis) due to getting older. Bone spurs that form near the nerves in the neck. What are the signs or symptoms? Pain. The pain may: Run from the neck to the arm and hand. Be very bad or irritating. Be worse when you move your neck. Loss of feeling or tingling in your arm or hand. Weakness in your arm or hand, in very bad cases. How is this treated? In many cases, treatment is not needed for this condition. With rest, the condition often gets better over time. If treatment is needed, options may include: Wearing a soft neck collar (cervical collar) for short periods of time, as told by your doctor. Doing exercises (physical therapy) to strengthen your neck muscles. Taking medicines. Having shots (injections) in your spine, in very bad cases. Having surgery. This may be needed if other treatments do not help. The type of surgery that is used depends on the cause of your condition. Follow these instructions at home: If you have a soft neck collar: Wear it as told by your doctor. Remove it only as told by your doctor. Ask your doctor if you can remove the collar for cleaning and bathing. If you are allowed to remove the collar for cleaning or bathing: Follow instructions from your doctor about how to remove  the collar safely. Clean the collar by wiping it with mild soap and water and drying it completely. Take out any removable pads in the collar every 1-2 days. Wash them by hand with soap and water. Let them air-dry completely before you put them back in the collar. Check your skin under the collar for redness or sores. If you see any, tell your doctor. Managing pain     Take over-the-counter and prescription medicines only as told by your doctor. If told, put ice on the painful area. If you have a soft neck collar, remove it as told by your doctor. Put ice in a plastic bag. Place a towel between your skin and the bag. Leave the ice on for 20 minutes, 2-3 times a day. If using ice does not help, you can try using heat. Use the heat source that your doctor recommends, such as a moist heat pack or a heating pad. Place a towel between your skin and the heat source. Leave the heat on for 20-30 minutes. Remove the heat if your skin turns bright red. This is very important if you are unable to feel pain, heat, or cold. You may have a greater risk of getting burned. You may try a gentle neck and shoulder rub (massage). Activity Rest as needed. Return to your normal activities as told by your doctor. Ask your doctor what activities are safe for you. Do exercises as told by your doctor or physical therapist. Do not lift anything that   is heavier than 10 lb (4.5 kg) until your doctor tells you that it is safe. General instructions Use a flat pillow when you sleep. Do not drive while wearing a soft neck collar. If you do not have a soft neck collar, ask your doctor if it is safe to drive while your neck heals. Ask your doctor if the medicine prescribed to you requires you to avoid driving or using heavy machinery. Do not use any products that contain nicotine or tobacco, such as cigarettes, e-cigarettes, and chewing tobacco. These can delay healing. If you need help quitting, ask your doctor. Keep all  follow-up visits as told by your doctor. This is important. Contact a doctor if: Your condition does not get better with treatment. Get help right away if: Your pain gets worse and is not helped with medicine. You lose feeling or feel weak in your hand, arm, face, or leg. You have a high fever. You have a stiff neck. You cannot control when you poop or pee (have incontinence). You have trouble with walking, balance, or talking. Summary Cervical radiculopathy means that a nerve in the neck is pinched or bruised. A nerve can get pinched from a bulging disk, arthritis, an injury to the neck, or other causes. Symptoms include pain, tingling, or loss of feeling that goes from the neck into the arm or hand. Weakness in your arm or hand can happen in very bad cases. Treatment may include resting, wearing a soft neck collar, and doing exercises. You might need to take medicines for pain. In very bad cases, shots or surgery may be needed. This information is not intended to replace advice given to you by your health care provider. Make sure you discuss any questions you have with your healthcare provider. Document Revised: 05/22/2018 Document Reviewed: 05/22/2018 Elsevier Patient Education  2022 [employer] Inc.  

## 2021-06-05 ENCOUNTER — Other Ambulatory Visit: Payer: Self-pay

## 2021-06-05 ENCOUNTER — Ambulatory Visit: Payer: Self-pay | Attending: Family Medicine

## 2021-06-06 ENCOUNTER — Other Ambulatory Visit: Payer: Self-pay

## 2021-06-13 ENCOUNTER — Other Ambulatory Visit: Payer: Self-pay

## 2021-06-15 ENCOUNTER — Other Ambulatory Visit: Payer: Self-pay

## 2021-07-17 ENCOUNTER — Other Ambulatory Visit: Payer: Self-pay

## 2021-07-17 ENCOUNTER — Ambulatory Visit: Payer: Self-pay | Attending: Family Medicine | Admitting: Family Medicine

## 2021-07-17 DIAGNOSIS — M25561 Pain in right knee: Secondary | ICD-10-CM

## 2021-07-17 DIAGNOSIS — G8929 Other chronic pain: Secondary | ICD-10-CM

## 2021-07-17 DIAGNOSIS — M549 Dorsalgia, unspecified: Secondary | ICD-10-CM

## 2021-07-17 DIAGNOSIS — M5412 Radiculopathy, cervical region: Secondary | ICD-10-CM

## 2021-07-17 DIAGNOSIS — K0889 Other specified disorders of teeth and supporting structures: Secondary | ICD-10-CM

## 2021-07-17 NOTE — Progress Notes (Signed)
Virtual Visit via Telephone Note  I connected with [name], on 07/17/2021 at 2:43 PM by telephone due to the COVID-19 pandemic and verified that I am speaking with the correct person using two identifiers.   Consent: I discussed the limitations, risks, security and privacy concerns of performing an evaluation and management service by telephone and the availability of in person appointments. I also discussed with the patient that there may be a patient responsible charge related to this service. The patient expressed understanding and agreed to proceed.   Location of Patient: Home  Location of Provider: Home Office   Persons participating in Telemedicine visit: Linnell Luedeman [place] Elmo Putt [place]-CMA Dr. Margarita Rana     History of Present Illness: Nicholas Roth is a 61 y.o. year old male with  a history of cervical radiculopathy, prediabetes (A1c 6.0) here for a follow up visit   Recently his R knee gave out on him and he fell; he does have chronic R knee -pain. His R shou;lder and R forearm have been hurting. His back also hurts in the lumbar region and he finds it difficult to walk. Symptoms are worse when he wakes up in the morning. Also using heating pads with mild relief Med list reveals he is on Cymbalta, Gabapentin, Mobic, Skelaxin. Previously seen by [facility] and surgery recommended. 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 (Patient not taking: Reported on 04/11/2021)     ALPRAZolam (XANAX) 1 MG tablet Take one tablet 30 min prior to MRI (Patient not taking: No sig reported) 1 tablet 0   DULoxetine (CYMBALTA) 60  MG capsule Take 1 capsule (60 mg total) by mouth daily. (Patient not taking: Reported on 04/11/2021) 30 capsule 3   gabapentin (NEURONTIN) 300 MG capsule TAKE 1 CAPSULE (300 MG TOTAL) BY MOUTH 2 (TWO) TIMES DAILY. (Patient not taking: Reported on 04/11/2021) 60 capsule 3   lidocaine (LIDODERM) 5 % Place 1 patch onto the skin daily as needed. Apply patch to area most significant pain once per day.  Remove and discard patch within 12 hours of application. (Patient not taking: Reported on 04/11/2021) 30 patch 3   meloxicam (MOBIC) 7.5 MG tablet TAKE 1 TABLET (7.5 MG TOTAL) BY MOUTH DAILY. (Patient not taking: Reported on 04/11/2021) 30 tablet 1   metaxalone (SKELAXIN) 800 MG tablet Take 1 tablet (800 mg total) by mouth 3 (three) times daily. (Patient not taking: Reported on 04/11/2021) 90 tablet 3   nicotine (NICODERM CQ) 14 mg/24hr patch Place 1 patch (14 mg total) onto the skin daily. (Patient not taking: Reported on 04/11/2021) 28 patch 2   No current facility-administered medications on file prior to visit.    ROS: See HPI  Observations/Objective: Awake, alert, oriented x3 Not in acute distress Normal mood   CMP Latest Ref Rng & Units 01/11/2021 01/23/2020 01/18/2020  Glucose 65 - 99 mg/dL 88 119(H) 102(H)  BUN 6 - 24 mg/dL 12 11 5(L)  Creatinine 0.76 - 1.27 mg/dL 0.95 1.03 0.97  Sodium 134 - 144 mmol/L 143 138 137  Potassium 3.5 - 5.2 mmol/L 4.1 3.8 3.2(L)  Chloride 96 - 106 mmol/L  103 104 102  CO2 20 - 29 mmol/L 23 25 23   Calcium 8.7 - 10.2 mg/dL 8.8 8.7(L) 8.5(L)  Total Protein 6.0 - 8.5 g/dL 7.4 7.5 -  Total Bilirubin 0.0 - 1.2 mg/dL 0.3 0.7 -  Alkaline Phos 44 - 121 IU/L 47 42 -  AST 0 - 40 IU/L 61(H) 87(H) -  ALT 0 - 44 IU/L 61(H) 79(H) -    Lipid Panel  No results found for: CHOL, TRIG, HDL, CHOLHDL, VLDL, LDLCALC, LDLDIRECT, LABVLDL  Lab Results  Component Value Date   HGBA1C 6.0 (A) 10/12/2020    Assessment and Plan: 1. Cervical radiculopathy Uncontrolled Surgery  recommended by Ortho He has been advised to call them to schedule an appointment  2. Chronic pain of right knee Currently on NSAID Consider imaging if persisting - Ambulatory referral to Physical Therapy  3. Musculoskeletal back pain Continue current medications Advised to apply heat or ice whichever is tolerated to painful areas. Counseled on evidence of improvement in pain control with regards to yoga, water aerobics, massage, home physical therapy, exercise as tolerated. - Ambulatory referral to Physical Therapy  4. Pain, dental - Ambulatory referral to Dentistry   Follow Up Instructions: 3 months   I discussed the assessment and treatment plan with the patient. The patient was provided an opportunity to ask questions and all were answered. The patient agreed with the plan and demonstrated an understanding of the instructions.   The patient was advised to call back or seek an in-person evaluation if the symptoms worsen or if the condition fails to improve as anticipated.     I provided 12 minutes total of non-face-to-face time during this encounter.   Charlott Rakes, MD, FAAFP. [facility], [place]   07/17/2021, 2:43 PM

## 2021-07-25 ENCOUNTER — Ambulatory Visit (INDEPENDENT_AMBULATORY_CARE_PROVIDER_SITE_OTHER): Payer: 59

## 2021-07-25 ENCOUNTER — Other Ambulatory Visit: Payer: Self-pay

## 2021-07-25 ENCOUNTER — Ambulatory Visit (INDEPENDENT_AMBULATORY_CARE_PROVIDER_SITE_OTHER): Payer: 59 | Admitting: Orthopaedic Surgery

## 2021-07-25 ENCOUNTER — Encounter: Payer: Self-pay | Admitting: Orthopaedic Surgery

## 2021-07-25 VITALS — BP 130/87 | HR 82 | Ht 68.0 in | Wt 161.0 lb

## 2021-07-25 DIAGNOSIS — M542 Cervicalgia: Secondary | ICD-10-CM

## 2021-07-25 DIAGNOSIS — M25511 Pain in right shoulder: Secondary | ICD-10-CM

## 2021-07-25 MED ORDER — DIAZEPAM 5 MG PO TABS: ORAL_TABLET | ORAL | 0 refills | Status: AC

## 2021-07-27 NOTE — Progress Notes (Signed)
Office Visit Note   Patient: Nicholas Roth           Date of Birth: 1960/08/12           MRN: [id number] Visit Date: 07/25/2021              Requested by: Hoy Register, MD [address] [place],  [place] [address] PCP: Hoy Register, MD   Assessment & Plan: Visit Diagnoses:  1. Acute pain of right shoulder   2. Neck pain   3. Cervicalgia     Plan: Patient needs new MRI scan and we reviewed today's previous scan.  As we discussed previously surgical treatment for this stenosis with severe foraminal stenosis and some central stenosis where he has abnormal cord signal may be necessary.  We will set him up for MRI with and without contrast for evaluation of the demyelinating areas at C5-6 and C6-7.  Office follow-up after scan.  Follow-Up Instructions: Return in about 2 weeks (around 08/08/2021).   Orders:  Orders Placed This Encounter  Procedures   XR Cervical Spine 2 or 3 views   XR Shoulder Right   MR Cervical Spine w/o contrast   Meds ordered this encounter  Medications   diazepam (VALIUM) 5 MG tablet    Sig: Take 2 tablets 1 hr before MRI scan. Take extra tablet with you    Dispense:  3 tablet    Refill:  0      Procedures: No procedures performed   Clinical Data: No additional findings.   Subjective: Chief Complaint  Patient presents with   Neck - Pain    HPI 61 year old male returns with cervical spondylosis and cervical cord myelomalacia.  He has not been seen since 01/03/2021.  His wife is a Engineer, civil (consulting) and returned with him today.  She states he fell 2 months ago since that time he has had more problems.  He states he feels like he is in a pass out when he turns his neck.  He has had difficulty walking with slower speed balance problems.  He has been on Mobic, Cymbalta and also gabapentin.  He is also noted some decreased range of motion of his shoulder.  Bilateral hand numbness.  Previous MRI 12/10/2020 showed degenerative changes high-grade  foraminal narrowing C5-6 C6-7.  T2 hyperintense signal left posterior lateral aspect of the cord at C5-6 and also at C6-7.  Patient and wife both think he has been worse in the last 2 months since he fell.  Review of Systems 14 point update unchanged from 01/03/2021 office visit other than as mentioned above.   Objective: Vital Signs: BP 130/87    Pulse 82    Ht 5\' 8"  (1.727 m)    Wt 161 lb (73 kg)    BMI 24.48 kg/m   Physical Exam Constitutional:      Appearance: He is well-developed.  HENT:     Head: Normocephalic and atraumatic.     Right Ear: External ear normal.     Left Ear: External ear normal.  Eyes:     Pupils: Pupils are equal, round, and reactive to light.  Neck:     Thyroid: No thyromegaly.     Trachea: No tracheal deviation.  Cardiovascular:     Rate and Rhythm: Normal rate.  Pulmonary:     Effort: Pulmonary effort is normal.     Breath sounds: No wheezing.  Abdominal:     General: Bowel sounds are normal.  Palpations: Abdomen is soft.  Musculoskeletal:     Cervical back: Neck supple.  Skin:    General: Skin is warm and dry.     Capillary Refill: Capillary refill takes less than 2 seconds.  Neurological:     Mental Status: He is alert and oriented to person, place, and time.  Psychiatric:        Behavior: Behavior normal.        Thought Content: Thought content normal.        Judgment: Judgment normal.    Ortho Exam patient has bilateral triceps weakness which is symmetrical.  One half grade biceps weakness.  Bilateral brachial plexus tenderness.  Knee and ankle jerk are again trace and symmetrical.  Slows slightly wide base gait with some balance problems when he changes directions.  He has right and left quad weakness symmetrical.  2 beat clonus right and left ankle.  Specialty Comments:  No specialty comments available.  Imaging: No results found.   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 No pertinent surgical history. Social History   Occupational History   Not on file  Tobacco Use   Smoking status: Every Day   Smokeless tobacco: Not on file  Substance and Sexual Activity   Alcohol use: Yes   Drug use: Yes    Types: Marijuana, Cocaine   Sexual activity: Not on file

## 2021-07-30 NOTE — Addendum Note (Signed)
Addended by: Marlyne Beards on: 07/30/2021 10:51 AM   Modules accepted: Orders

## 2021-08-06 ENCOUNTER — Other Ambulatory Visit: Payer: Self-pay

## 2021-08-06 ENCOUNTER — Ambulatory Visit (HOSPITAL_COMMUNITY)
Admission: RE | Admit: 2021-08-06 | Discharge: 2021-08-06 | Disposition: A | Discharge status: Home / Self Care | Payer: Self-pay | Source: Ambulatory Visit | Attending: Orthopaedic Surgery | Admitting: Orthopaedic Surgery

## 2021-08-06 ENCOUNTER — Other Ambulatory Visit: Payer: Self-pay | Admitting: Orthopaedic Surgery

## 2021-08-06 DIAGNOSIS — M542 Cervicalgia: Secondary | ICD-10-CM | POA: Insufficient documentation

## 2021-08-06 MED ORDER — GADOBUTROL 1 MMOL/ML IV SOLN
7.5000 mL | Freq: Once | INTRAVENOUS | Status: AC | PRN
  Administered 2021-08-06: 7.5 mL via INTRAVENOUS

## 2021-08-08 ENCOUNTER — Ambulatory Visit (INDEPENDENT_AMBULATORY_CARE_PROVIDER_SITE_OTHER): Payer: Self-pay | Admitting: Orthopaedic Surgery

## 2021-08-08 ENCOUNTER — Other Ambulatory Visit: Payer: Self-pay

## 2021-08-08 ENCOUNTER — Encounter: Payer: Self-pay | Admitting: Orthopaedic Surgery

## 2021-08-08 VITALS — BP 128/82 | HR 71 | Ht 68.0 in | Wt 161.0 lb

## 2021-08-08 DIAGNOSIS — M4802 Spinal stenosis, cervical region: Secondary | ICD-10-CM

## 2021-08-08 DIAGNOSIS — M4722 Other spondylosis with radiculopathy, cervical region: Secondary | ICD-10-CM

## 2021-08-08 NOTE — Progress Notes (Signed)
Office Visit Note   Patient: Nicholas Roth           Date of Birth: September 06, 1960           MRN: [id number] Visit Date: 08/08/2021              Requested by: Hoy Register, MD [address] [place],  [place] [address] PCP: Hoy Register, MD   Assessment & Plan: Visit Diagnoses:  1. Foraminal stenosis of cervical region   2. Other spondylosis with radiculopathy, cervical region     Plan: We reviewed his repeat MRI scan.  There is no myelopathic changes in the cord he does not have any central stenosis.  He does have areas of moderate to severe foraminal stenosis.  He states he has good strength in his arms just numbness primarily left hand.  We discussed that there is no evidence of cord areas that may have damage ,surgeries is not required at this point to protect the cord.  MRI images were reviewed with him as well as report and previous images.  I plan to recheck him in 6 months.  Follow-Up Instructions: Return in about 6 months (around 02/05/2022).   Orders:  No orders of the defined types were placed in this encounter.  No orders of the defined types were placed in this encounter.     Procedures: No procedures performed   Clinical Data: No additional findings.   Subjective: Chief Complaint  Patient presents with   Neck - Pain, Follow-up    MRI cervical spine review    HPI 61 year old male returns with ongoing problems with neck pain.  He states last night his neck popped when he is watching TV and he had some increased discomfort.  Patient's previous MRI in May showed questionable area of cord myelomalacia versus artifact.  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  MRI was obtained yesterday and this shows no cord myelomalacia changes.  He does have foraminal narrowing at C5-6 C6-7 also at C7-T1.  Review of Systems updated unchanged.   Objective: Vital Signs: BP 128/82     Pulse 71    Ht 5\' 8"  (1.727 m)    Wt 161 lb (73 kg)    BMI 24.48 kg/m   Physical Exam Constitutional:      Appearance: He is well-developed.  HENT:     Head: Normocephalic and atraumatic.     Right Ear: External ear normal.     Left Ear: External ear normal.  Eyes:     Pupils: Pupils are equal, round, and reactive to light.  Neck:     Thyroid: No thyromegaly.     Trachea: No tracheal deviation.  Cardiovascular:     Rate and Rhythm: Normal rate.  Pulmonary:     Effort: Pulmonary effort is normal.     Breath sounds: No wheezing.  Abdominal:     General: Bowel sounds are normal.     Palpations: Abdomen is soft.  Musculoskeletal:     Cervical back: Neck supple.  Skin:    General: Skin is warm and dry.     Capillary Refill: Capillary refill takes less than 2 seconds.  Neurological:     Mental Status: He is alert and oriented to person, place, and time.  Psychiatric:        Behavior: Behavior normal.        Thought Content: Thought  content normal.        Judgment: Judgment normal.    Ortho Exam biceps triceps with the clot encouragement demonstrates good strength.  Reflexes are intact upper and lower no lower extremity, no clonus.  He has some tenderness in his right knee.  Specialty Comments:  No specialty comments available.  Imaging: Narrative & Impression  CLINICAL DATA:  Chronic neck pain   EXAM: MRI CERVICAL SPINE WITHOUT AND WITH CONTRAST   TECHNIQUE: Multiplanar and multiecho pulse sequences of the cervical spine, to include the craniocervical junction and cervicothoracic junction, were obtained without and with intravenous contrast.   CONTRAST:  7.67mL GADAVIST GADOBUTROL 1 MMOL/ML IV SOLN   COMPARISON:  MRI cervical spine 12/09/2020   FINDINGS: Alignment: Slight anterolisthesis C4-5. Mild retrolisthesis C5-6 and C6-7. Mild scoliosis.   Vertebrae: Negative for fracture or mass   Cord: Normal signal and morphology.   Posterior Fossa, vertebral  arteries, paraspinal tissues: Negative   Disc levels:   C2-3: Mild facet degeneration.  Mild right foraminal narrowing   C3-4: Mild disc and mild facet degeneration. Moderate left foraminal narrowing.   C4-5: Mild anterolisthesis. Disc and facet degeneration. Moderate left foraminal narrowing and mild right foraminal narrowing, unchanged   C5-6: Disc and facet degeneration with spurring. Moderate foraminal narrowing bilaterally, with progression. Spinal canal adequate in size   C6-7: Disc and facet degeneration with spurring. Severe foraminal narrowing bilaterally with progression from the prior study.   C7-T1: Bilateral facet hypertrophy. Moderate to severe left foraminal encroachment, unchanged   IMPRESSION: Multilevel cervical spondylosis. Multilevel spinal and foraminal stenosis as above   Progressive spurring and foraminal encroachment bilaterally at C5-6 and C6-7. Moderate to severe left foraminal encroachment C7-T1 unchanged.     Electronically Signed   By: Marlan Palau M.D.   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 Social History   Occupational History   Not on file  Tobacco Use   Smoking status: Every Day   Smokeless tobacco: Not on file  Substance and Sexual Activity   Alcohol use: Yes   Drug use: Yes    Types: Marijuana, Cocaine   Sexual activity: Not on file

## 2021-08-20 ENCOUNTER — Ambulatory Visit: Payer: Self-pay | Attending: Family Medicine | Admitting: Physical Therapy

## 2021-08-20 ENCOUNTER — Other Ambulatory Visit: Payer: Self-pay

## 2021-08-20 ENCOUNTER — Encounter: Payer: Self-pay | Admitting: Physical Therapy

## 2021-08-20 DIAGNOSIS — M25511 Pain in right shoulder: Secondary | ICD-10-CM | POA: Insufficient documentation

## 2021-08-20 DIAGNOSIS — M542 Cervicalgia: Secondary | ICD-10-CM | POA: Insufficient documentation

## 2021-08-20 DIAGNOSIS — M6281 Muscle weakness (generalized): Secondary | ICD-10-CM | POA: Insufficient documentation

## 2021-08-20 DIAGNOSIS — M25561 Pain in right knee: Secondary | ICD-10-CM | POA: Insufficient documentation

## 2021-08-20 DIAGNOSIS — G8929 Other chronic pain: Secondary | ICD-10-CM | POA: Insufficient documentation

## 2021-08-20 DIAGNOSIS — M549 Dorsalgia, unspecified: Secondary | ICD-10-CM | POA: Insufficient documentation

## 2021-08-20 NOTE — Therapy (Signed)
OUTPATIENT PHYSICAL THERAPY EVALUATION   Patient Name: Nicholas Roth MRN: [id number] DOB:05/05/1961, 61 y.o., male Today's Date: 08/20/2021   PT End of Session - 08/20/21 1325     Visit Number 1    Number of Visits 8    Date for PT Re-Evaluation 10/15/21    Authorization Type CAFA    PT Start Time 1215    PT Stop Time 1300    PT Time Calculation (min) 45 min    Activity Tolerance Patient limited by pain    Behavior During Therapy [facility] for tasks assessed/performed             Past Medical History:  Diagnosis Date   Foraminal stenosis of cervical region 01/03/2021   History reviewed. No pertinent surgical history. 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    PCP: Hoy Register, MD  REFERRING PROVIDER: Hoy Register, MD  REFERRING DIAG: Musculoskeletal back pain, Chronic pain of right knee  THERAPY DIAG:  Cervicalgia  Muscle weakness (generalized)  Chronic right shoulder pain  Chronic pain of right knee  ONSET DATE: ongoing for 8-9 months per patient   SUBJECTIVE:         SUBJECTIVE STATEMENT: Patient reports he gets a bad neck pain, and he is also having some weakness of the left hand. States he has been having this pain for over 8-9 months, with no apparent mechanism of onset. He has not tried any other treatments for this condition. Patient also notes right shoulder pain and limitation, and right knee pain that has caused a fall in the past.  PERTINENT HISTORY:  None  PAIN:  Are you having pain? Yes NPRS scale: 8/10 Pain location: Neck (left hand radiating pain) Pain orientation: Left  PAIN TYPE: Chronic Pain description: intermittent, aching, tightness, sharp popping Aggravating factors: Sleeping on left side, turning head to the left while driving, grasping or lifting objects with left  hand Relieving factors: Medication  Are you having pain? Yes NPRS scale: 7.5/10 Pain location: Knee  Pain orientation: Right PAIN TYPE: Chronic Pain description: intermittent, aching, sharp Aggravating factors: Walking, bending the knee Relieving factors: Medication  Are you having pain? Yes NPRS scale: 8/10 Pain location: Shoulder Pain orientation: Right PAIN TYPE: Chronic Pain description: intermittent, sharp Aggravating factors: Using the right arm, laying on right side, night pain Relieving factors: Medication  PRECAUTIONS: None  WEIGHT BEARING RESTRICTIONS No  FALLS:  Has patient fallen in last 6 months? Yes Number of falls: 1  LIVING ENVIRONMENT: Lives with: lives with their family  OCCUPATION: Unemployed  PLOF: Independent  PATIENT GOALS: Want to go back to work and be able lift objects   OBJECTIVE:  DIAGNOSTIC FINDINGS:  Cervical MRI: 08/06/2021 IMPRESSION: Multilevel cervical spondylosis. Multilevel spinal and foraminal stenosis as above Progressive spurring and foraminal encroachment bilaterally at C5-6 and C6-7. Moderate to severe left foraminal encroachment C7-T1 unchanged.  Right Shoulder X-ray 07/25/2021: Impression: Right shoulder negative for acute changes  PATIENT SURVEYS:  FOTO 25% functional status  (neck)  COGNITION: Overall cognitive status: Within functional limits for tasks assessed   SENSATION: Light touch: Deficits patient reports diminished sensation of   POSTURE:  Rounded shoulder and forward head posture  PALPATION: Patient reports tenderness with light palpation to bilateral upper trap regions, cervical paraspinals, suboccipitals, right periacromial region   CERVICAL AROM/PROM  A/PROM A/PROM (deg) 08/20/2021  Flexion 40  Extension 30  Right lateral flexion 15  Left lateral flexion 5  Right rotation 55  Left rotation 35   UE AROM/PROM:  A/PROM Right 08/20/2021 Left 08/20/2021  Shoulder flexion 125 160  Shoulder  abduction 110 160   UE MMT:  MMT Right 08/20/2021 Left 08/20/2021  Shoulder flexion 3- 4  Shoulder abduction 3- 4  Shoulder internal rotation 4 4  Shoulder external rotation 3- 4  Elbow flexion 4 4  Elbow extension 4 4  Wrist flexion 4 4  Wrist extension 4 4  Grip strength* 30 15   *avg of 3 trials  CERVICAL SPECIAL TESTS:  Spurling's test: Positive left  FUNCTIONAL TESTS:  Not assessed   TODAY'S TREATMENT:  Supine shoulder dowel press x 5 Supine chin tuck 5 x 5 sec Supine cervical rotation x 5 each Seated shoulder blade squeeze 5 x 5 sec  PATIENT EDUCATION:  Education details: Exam findings, POC, HEP Person educated: Patient Education method: Explanation, Demonstration, Tactile cues, Verbal cues, and Handouts Education comprehension: verbalized understanding, returned demonstration, verbal cues required, tactile cues required, and needs further education  HOME EXERCISE PROGRAM: Access Code: [email or web address]   ASSESSMENT: CLINICAL IMPRESSION: Patient is a 61 y.o. male who was seen today for physical therapy evaluation and treatment for multiple complaints consisting of primary left sided neck pain with likely cervical radiculopathy affecting left hand, right shoulder pain most consistent with rotator cuff pathology, and right knee pain that was not fully assessed this visit due to time constraints. Patient demonstrated limitation in cervical and right shoulder motion, generalized strength deficits of BUE most pronounced with right shoulder impairment and grip strength impairments, pain and report of popping with all neck and shoulder motion. Objective impairments include Abnormal gait, decreased activity tolerance, decreased ROM, decreased strength, impaired sensation, postural dysfunction, and pain. These impairments are limiting patient from cleaning, community activity, driving, meal prep, occupation, and yard work. Personal factors including Past/current experiences and Time since  onset of injury/illness/exacerbation are also affecting patient's functional outcome. Patient will benefit from skilled PT to address above impairments and improve overall function.  REHAB POTENTIAL: Good  CLINICAL DECISION MAKING: Stable/uncomplicated  EVALUATION COMPLEXITY: Low   GOALS: Goals reviewed with patient? No  SHORT TERM GOALS:  STG Name Target Date Goal status  1 Patient will be I with initial HEP in order to progress with therapy. Baseline: provided at eval 09/17/2021 INITIAL  2 PT will review FOTO with patient by 3rd visit in order to understand expected progress and outcome with therapy. Baseline: assessed at eval 09/17/2021 INITIAL  3 Patient will report pain level of neck, right shoulder, and right knee </= 6/10 in order to reduce functional limitations Baseline: pain level 7-8/19 09/17/2021 INITIAL   LONG TERM GOALS:   LTG Name Target Date Goal status  1 Patient will be I with final HEP to maintain progress from PT. Baseline: 10/15/2021 INITIAL  2 Patient will report >/= 46% status on FOTO to indicate improved functional ability. Baseline: 25% functional status 10/15/2021 INITIAL  3 Patient will demonstrate left cervical rotation >/= 55 deg bilaterally to improve driving Baseline: 35 deg 11/18/3891 INITIAL  4 Patient will demonstrate right shoulder elevation >/= 150 deg to improve overhead reach and household tasks Baseline: 125 deg 10/15/2021 INITIAL  5 Patient will report pain level </= 5/10 in order to reduce functional limitations Baseline: 7-8/10 pain 10/15/2021 INITIAL   PLAN: PT FREQUENCY: 1x/week  PT DURATION: 8 weeks  PLANNED INTERVENTIONS: Therapeutic  exercises, Therapeutic activity, Neuro Muscular re-education, Balance training, Gait training, Patient/Family education, Joint mobilization, Aquatic Therapy, Dry Needling, Electrical stimulation, Spinal mobilization, Cryotherapy, Moist heat, Taping, Traction, and Manual therapy  PLAN FOR NEXT SESSION: Review HEP  and progress PRN, assess right knee pain further and treat PRN, manual/dry needling for cervical regions, trial SNAG for cervical rotation, postural and rotator cuff strengthening   Rosana Hoes, PT, DPT, LAT, ATC 08/20/21  1:45 PM Phone: [phone] Fax: [phone]

## 2021-08-20 NOTE — Patient Instructions (Signed)
Access Code: [email or web address] URL: [email or web address] Date: 08/20/2021 Prepared by: Rosana Hoes  Exercises Supine Shoulder Press with Dowel - 1-2 x daily - 7 x weekly - 2 sets - 5 reps Supine Chin Tucks on Flat Ball - 1-2 x daily - 7 x weekly - 2 sets - 5 reps - 5 seconds hold Supine Cervical Rotation AROM on Flat Ball - 1-2 x daily - 7 x weekly - 2 sets - 5 reps Seated Scapular Retraction - 1-2 x daily - 7 x weekly - 2 sets - 5 reps - 5 seconds hold

## 2021-08-27 ENCOUNTER — Other Ambulatory Visit: Payer: Self-pay

## 2021-08-29 NOTE — Therapy (Signed)
OUTPATIENT PHYSICAL THERAPY TREATMENT NOTE   Patient Name: Nicholas Roth MRN: [id number] DOB:08/30/1960, 61 y.o., male Today's Date: 08/30/2021  PCP: Charlott Rakes, MD REFERRING PROVIDER: Charlott Rakes, MD   PT End of Session - 08/30/21 1451     Visit Number 2    Number of Visits 8    Date for PT Re-Evaluation 10/15/21    Authorization Type CAFA    PT Start Time 1451    PT Stop Time 1529    PT Time Calculation (min) 38 min    Activity Tolerance Patient limited by pain    Behavior During Therapy [facility] for tasks assessed/performed             Past Medical History:  Diagnosis Date   Foraminal stenosis of cervical region 01/03/2021   History reviewed. No pertinent surgical history. 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    REFERRING PROVIDER: Charlott Rakes, MD   REFERRING DIAG: Musculoskeletal back pain, Chronic pain of right knee  THERAPY DIAG:  Cervicalgia  Muscle weakness (generalized)  Chronic right shoulder pain  Chronic pain of right knee  PERTINENT HISTORY: None  PRECAUTIONS: None  SUBJECTIVE: Patient states he is not doing good today. His neck and back are really bothering him and he couldn't sleep due to the pain. He states his knee is better today than the first time but still painful. He states he has not done his exercises since the first visit because he has been in pain.  PAIN:  Are you having pain? Yes NPRS scale: 9/10 Pain location: Neck (left hand radiating pain) Pain orientation: Left  PAIN TYPE: Chronic Pain description: intermittent, aching, tightness, sharp popping Aggravating factors: Sleeping on left side, turning head to the left while driving, grasping or lifting objects with left hand Relieving factors: Medication   Are you having pain? Yes NPRS scale:  7/10 Pain location: Knee  Pain orientation: Right PAIN TYPE: Chronic Pain description: intermittent, aching, sharp Aggravating factors: Walking, bending the knee Relieving factors: Medication   Are you having pain? Yes NPRS scale: 8/10 Pain location: Shoulder Pain orientation: Right PAIN TYPE: Chronic Pain description: intermittent, sharp Aggravating factors: Using the right arm, laying on right side, night pain Relieving factors: Medication  PATIENT GOALS: Want to go back to work and be able lift objects   OBJECTIVE:  PATIENT SURVEYS:  FOTO 25% functional status  (neck)    POSTURE:  Rounded shoulder and forward head posture   PALPATION: Patient reports tenderness with light palpation to bilateral upper trap regions, cervical paraspinals, suboccipitals, right periacromial region             CERVICAL AROM/PROM   A/PROM A/PROM (deg) 08/20/2021  Flexion 40  Extension 30  Right lateral flexion 15  Left lateral flexion 5  Right rotation 55  Left rotation 35    UE AROM/PROM:   A/PROM Right 08/20/2021 Left 08/20/2021  Shoulder flexion 125 160  Shoulder abduction 110 160    UE MMT:   MMT Right 08/20/2021 Left 08/20/2021  Shoulder flexion 3- 4  Shoulder abduction 3- 4  Shoulder internal rotation 4 4  Shoulder external rotation 3- 4  Elbow flexion 4 4  Elbow extension 4 4  Wrist flexion 4 4  Wrist extension 4 4  Grip strength* 30 15   LE AROM:   AROM Right 08/30/2021  Left 08/30/2021  Knee  flexion 100 130  Knee extension -3 0   LE MMT:   MMT Right 08/30/2021 Left 08/30/2021  Shoulder flexion - 4  Shoulder abduction - 4    Unable to assess right knee strength due to pain   TODAY'S TREATMENT:  08/20/2021:  Therapeutic Exercise: Supine heel slide 2 x 3 Supine quad set 2 x 10 with small towel roll under knee Supine shoulder flexion AAROM with hands grasped 2 x 3 Seated shoulder blade squeeze 5 x 5 sec Trialed performing but patient unable to  tolerate: Supine shoulder dowel press  Supine chin tuck Supine cervical rotation MHP post-therapy for low back and neck x 5 min    08/20/2021: (evaluation)  Therapeutic Exercise: Supine shoulder dowel press x 5 Supine chin tuck 5 x 5 sec Supine cervical rotation x 5 each Seated shoulder blade squeeze 5 x 5 sec   PATIENT EDUCATION:  Education details: HEP update Person educated: Patient Education method: Consulting civil engineer, Demonstration, Tactile cues, Verbal cues, and Handouts Education comprehension: verbalized understanding, returned demonstration, verbal cues required, tactile cues required, and needs further education   HOME EXERCISE PROGRAM: Access Code: [email or web address]     ASSESSMENT: CLINICAL IMPRESSION: Patient with poor tolerance for therapy this visit due to new onset of back pain and high levels of left sided neck, right shoulder, and right knee pain. Assessed right knee this visit but patient with significant limitations due to pain and weakness. Patient was unable to tolerate any neck or much right shoulder therapy due to pain. Patient given MHP for neck and low back post therapy and he reported feeling better following the heat. Patient provided updated HEP to include some knee exercises. Patient would benefit from continued skilled PT to progress his mobility and strength in order to reduce pain and maximize functional ability.  Objective impairments include Abnormal gait, decreased activity tolerance, decreased ROM, decreased strength, impaired sensation, postural dysfunction, and pain.     GOALS: Goals reviewed with patient? No   SHORT TERM GOALS:   STG Name Target Date Goal status  1 Patient will be I with initial HEP in order to progress with therapy. Baseline: provided at eval 09/17/2021 INITIAL  2 PT will review FOTO with patient by 3rd visit in order to understand expected progress and outcome with therapy. Baseline: assessed at eval 09/17/2021 INITIAL  3 Patient will  report pain level of neck, right shoulder, and right knee </= 6/10 in order to reduce functional limitations Baseline: pain level 7-8/19 09/17/2021 INITIAL    LONG TERM GOALS:    LTG Name Target Date Goal status  1 Patient will be I with final HEP to maintain progress from PT. Baseline: 10/15/2021 INITIAL  2 Patient will report >/= 46% status on FOTO to indicate improved functional ability. Baseline: 25% functional status 10/15/2021 INITIAL  3 Patient will demonstrate left cervical rotation >/= 55 deg bilaterally to improve driving Baseline: 35 deg 10/15/2021 INITIAL  4 Patient will demonstrate right shoulder elevation >/= 150 deg to improve overhead reach and household tasks Baseline: 125 deg 10/15/2021 INITIAL  5 Patient will report pain level </= 5/10 in order to reduce functional limitations Baseline: 7-8/10 pain 10/15/2021 INITIAL     PLAN: PT FREQUENCY: 1x/week   PT DURATION: 8 weeks   PLANNED INTERVENTIONS: Therapeutic exercises, Therapeutic activity, Neuro Muscular re-education, Balance training, Gait training, Patient/Family education, Joint mobilization, Aquatic Therapy, Dry Needling, Electrical stimulation, Spinal mobilization, Cryotherapy, Moist heat, Taping, Traction, and Manual therapy  PLAN FOR NEXT SESSION: Review HEP and progress PRN, assess right knee pain further and treat PRN, manual/dry needling for cervical regions, trial SNAG for cervical rotation, postural and rotator cuff strengthening    Hilda Blades, PT, DPT, LAT, ATC 08/30/21  3:33 PM Phone: [phone] Fax: [phone]

## 2021-08-30 ENCOUNTER — Encounter: Payer: Self-pay | Admitting: Physical Therapy

## 2021-08-30 ENCOUNTER — Other Ambulatory Visit: Payer: Self-pay

## 2021-08-30 ENCOUNTER — Ambulatory Visit: Payer: Self-pay | Admitting: Physical Therapy

## 2021-08-30 DIAGNOSIS — G8929 Other chronic pain: Secondary | ICD-10-CM

## 2021-08-30 DIAGNOSIS — M6281 Muscle weakness (generalized): Secondary | ICD-10-CM

## 2021-08-30 DIAGNOSIS — M542 Cervicalgia: Secondary | ICD-10-CM

## 2021-08-30 NOTE — Patient Instructions (Signed)
Access Code: [email or web address] URL: [email or web address] Date: 08/30/2021 Prepared by: Rosana Hoes  Exercises Supine Shoulder Flexion AAROM with Hands Clasped - 1-2 x daily - 7 x weekly - 2 sets - 5 reps Supine Chin Tucks on Flat Ball - 1-2 x daily - 7 x weekly - 2 sets - 5 reps - 5 seconds hold Supine Cervical Rotation AROM on Flat Ball - 1-2 x daily - 7 x weekly - 2 sets - 5 reps Seated Scapular Retraction - 1-2 x daily - 7 x weekly - 2 sets - 5 reps - 5 seconds hold Supine Quad Set - 1-2 x daily - 7 x weekly - 2 sets - 10 reps - 5 seconds hold Supine Heel Slide - 1 x daily - 7 x weekly - 2 sets - 5 reps - 5 seconds hold

## 2021-08-31 ENCOUNTER — Other Ambulatory Visit: Payer: Self-pay

## 2021-08-31 MED FILL — Gabapentin Cap 300 MG: ORAL | 30 days supply | Qty: 60 | Fill #0 | Status: AC

## 2021-09-03 ENCOUNTER — Other Ambulatory Visit: Payer: Self-pay

## 2021-09-06 ENCOUNTER — Ambulatory Visit: Payer: Self-pay | Admitting: Physical Therapy

## 2021-09-06 ENCOUNTER — Other Ambulatory Visit: Payer: Self-pay

## 2021-09-06 ENCOUNTER — Encounter: Payer: Self-pay | Admitting: Physical Therapy

## 2021-09-06 DIAGNOSIS — G8929 Other chronic pain: Secondary | ICD-10-CM

## 2021-09-06 DIAGNOSIS — M25561 Pain in right knee: Secondary | ICD-10-CM

## 2021-09-06 DIAGNOSIS — M6281 Muscle weakness (generalized): Secondary | ICD-10-CM

## 2021-09-06 DIAGNOSIS — M542 Cervicalgia: Secondary | ICD-10-CM

## 2021-09-06 NOTE — Therapy (Signed)
OUTPATIENT PHYSICAL THERAPY TREATMENT NOTE   Patient Name: Nicholas Roth MRN: [id number] DOB:April 25, 1961, 61 y.o., male Today's Date: 09/06/2021  PCP: Hoy Register, MD REFERRING PROVIDER: Hoy Register, MD   PT End of Session - 09/06/21 1444     Visit Number 3    Number of Visits 8    Date for PT Re-Evaluation 10/15/21    Authorization Type CAFA    PT Start Time 1445    PT Stop Time 1525    PT Time Calculation (min) 40 min    Activity Tolerance Patient limited by pain    Behavior During Therapy [facility] for tasks assessed/performed              Past Medical History:  Diagnosis Date   Foraminal stenosis of cervical region 01/03/2021   History reviewed. No pertinent surgical history. 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    REFERRING PROVIDER: Hoy Register, MD   REFERRING DIAG: Musculoskeletal back pain, Chronic pain of right knee  THERAPY DIAG:  Cervicalgia  Muscle weakness (generalized)  Chronic right shoulder pain  Chronic pain of right knee  PERTINENT HISTORY: None  PRECAUTIONS: None  SUBJECTIVE: Patient reports he is doing a little better. His lower back locked up on him a few days ago but has loosened up by now. He still has trouble turning his neck and the shoulder is painful, also notes his right knee continues to bother him.  PAIN:  Are you having pain? Yes NPRS scale: 8/10 Pain location: Neck (left hand radiating pain) Pain orientation: Left  PAIN TYPE: Chronic Pain description: intermittent, aching, tightness, sharp popping Aggravating factors: Sleeping on left side, turning head to the left while driving, grasping or lifting objects with left hand Relieving factors: Medication   Are you having pain? Yes NPRS scale: 7/10 Pain location: Knee  Pain orientation: Right PAIN  TYPE: Chronic Pain description: intermittent, aching, sharp Aggravating factors: Walking, bending the knee Relieving factors: Medication   Are you having pain? Yes NPRS scale: 8/10 Pain location: Shoulder Pain orientation: Right PAIN TYPE: Chronic Pain description: intermittent, sharp Aggravating factors: Using the right arm, laying on right side, night pain Relieving factors: Medication  PATIENT GOALS: Want to go back to work and be able lift objects   OBJECTIVE:  PATIENT SURVEYS:  FOTO 25% functional status  (neck)    POSTURE:  Rounded shoulder and forward head posture   PALPATION: Patient reports tenderness with light palpation to bilateral upper trap regions, cervical paraspinals, suboccipitals, right periacromial region             CERVICAL AROM/PROM   A/PROM A/PROM (deg) 08/20/2021  Flexion 40  Extension 30  Right lateral flexion 15  Left lateral flexion 5  Right rotation 55  Left rotation 35    UE AROM/PROM:   A/PROM Right 08/20/2021 Left 08/20/2021  Shoulder flexion 125 160  Shoulder abduction 110 160    UE MMT:   MMT Right 08/20/2021 Left 08/20/2021  Shoulder flexion 3- 4  Shoulder abduction 3- 4  Shoulder internal rotation 4 4  Shoulder external rotation 3- 4  Elbow flexion 4 4  Elbow extension 4 4  Wrist flexion 4 4  Wrist extension 4 4  Grip strength* 30 15   LE AROM:   AROM Right 08/30/2021 Left 08/30/2021  Knee  flexion 100 130  Knee extension -3 0   LE MMT:   MMT Right 08/30/2021 Left 08/30/2021  Shoulder flexion - 4  Shoulder abduction - 4    Unable to assess right knee strength due to pain   TODAY'S TREATMENT:  09/06/2021:  Therapeutic Exercise: NuStep L5 x 5 min with UE/LE while taking subjective SAQ 2 x 10 (right only) - unable to achieve full knee extension Supine shoulder press 2 x 3 each Supine cervical retraction 2 x 5 Supine cervical rotation 2 x 5 each Supine heel slide 2 x 3 (right only) Side clamshell 2 x 5 (right  only) Sidelying shoulder ER 2 x 5 (right only) LAQ partial range 2 x 5 (right only) Overhead pulleys shoulder flexion x 4 min  - partial range  Row machine with vertical handles 20# x 10 - partial range Wall walk shoulder flexion x 5 (right only)   08/30/2021:  Therapeutic Exercise: Supine heel slide 2 x 3 Supine quad set 2 x 10 with small towel roll under knee Supine shoulder flexion AAROM with hands grasped 2 x 3 Seated shoulder blade squeeze 5 x 5 sec Trialed performing but patient unable to tolerate: Supine shoulder dowel press  Supine chin tuck Supine cervical rotation MHP post-therapy for low back and neck x 5 min   08/20/2021: (evaluation)  Therapeutic Exercise: Supine shoulder dowel press x 5 Supine chin tuck 5 x 5 sec Supine cervical rotation x 5 each Seated shoulder blade squeeze 5 x 5 sec   PATIENT EDUCATION:  Education details: HEP update Person educated: Patient Education method: Programmer, multimedia, Demonstration, Tactile cues, Verbal cues, and Handouts Education comprehension: verbalized understanding, returned demonstration, verbal cues required, tactile cues required, and needs further education   HOME EXERCISE PROGRAM: Access Code: [email or web address]     ASSESSMENT: CLINICAL IMPRESSION: Patient with poor tolerance for therapy this visit due to high levels of left sided neck, right shoulder, and right knee pain. He was able to perform more exercise this visit but with low reps and increased rest time between exercises. Pain continues to be a limiting factor with progression in therapy. He continues to demonstrate significant limitations with right shoulder motion and strength, and he is hesitant to perform cervical movements due to fear of having pop in his neck. Patient would benefit from continued skilled PT to progress his mobility and strength in order to reduce pain and maximize functional ability.  Objective impairments include Abnormal gait, decreased activity tolerance,  decreased ROM, decreased strength, impaired sensation, postural dysfunction, and pain.     GOALS: Goals reviewed with patient? No   SHORT TERM GOALS:   STG Name Target Date Goal status  1 Patient will be I with initial HEP in order to progress with therapy. Baseline: provided at eval 09/17/2021 INITIAL  2 PT will review FOTO with patient by 3rd visit in order to understand expected progress and outcome with therapy. Baseline: assessed at eval 09/17/2021 INITIAL  3 Patient will report pain level of neck, right shoulder, and right knee </= 6/10 in order to reduce functional limitations Baseline: pain level 7-8/19 09/17/2021 INITIAL    LONG TERM GOALS:    LTG Name Target Date Goal status  1 Patient will be I with final HEP to maintain progress from PT. Baseline: 10/15/2021 INITIAL  2 Patient will report >/= 46% status on FOTO to indicate improved functional ability. Baseline: 25% functional status 10/15/2021 INITIAL  3 Patient will demonstrate left cervical rotation >/= 55 deg bilaterally to improve driving Baseline:  35 deg 10/15/2021 INITIAL  4 Patient will demonstrate right shoulder elevation >/= 150 deg to improve overhead reach and household tasks Baseline: 125 deg 10/15/2021 INITIAL  5 Patient will report pain level </= 5/10 in order to reduce functional limitations Baseline: 7-8/10 pain 10/15/2021 INITIAL     PLAN: PT FREQUENCY: 1x/week   PT DURATION: 8 weeks   PLANNED INTERVENTIONS: Therapeutic exercises, Therapeutic activity, Neuro Muscular re-education, Balance training, Gait training, Patient/Family education, Joint mobilization, Aquatic Therapy, Dry Needling, Electrical stimulation, Spinal mobilization, Cryotherapy, Moist heat, Taping, Traction, and Manual therapy   PLAN FOR NEXT SESSION: Review HEP and progress PRN, assess right knee pain further and treat PRN, manual/dry needling for cervical regions, trial SNAG for cervical rotation, postural and rotator cuff  strengthening    Rosana Hoes, PT, DPT, LAT, ATC 09/06/21  3:25 PM Phone: [phone] Fax: [phone]

## 2021-09-06 NOTE — Patient Instructions (Signed)
Access Code: [email or web address] URL: [email or web address] Date: 09/06/2021 Prepared by: Rosana Hoes  Exercises Supine Shoulder Flexion AAROM with Hands Clasped - 1-2 x daily - 2 sets - 5 reps Supine Chin Tucks on Flat Ball - 1-2 x daily - 2 sets - 5 reps - 5 seconds hold Supine Cervical Rotation AROM on Flat Ball - 1-2 x daily - 2 sets - 5 reps Seated Scapular Retraction - 1-2 x daily - 2 sets - 5 reps - 5 seconds hold Supine Quad Set - 1-2 x daily - 2 sets - 10 reps - 5 seconds hold Supine Heel Slide - 1 x daily - 2 sets - 5 reps - 5 seconds hold Seated Long Arc Quad - 1 x daily - 2 sets - 5 reps

## 2021-09-10 ENCOUNTER — Other Ambulatory Visit: Payer: Self-pay

## 2021-09-10 NOTE — Therapy (Signed)
OUTPATIENT PHYSICAL THERAPY TREATMENT NOTE   Patient Name: Nicholas Roth MRN: [id number] DOB:17-Mar-1961, 61 y.o., male Today's Date: 09/13/2021  PCP: Hoy Register, MD REFERRING PROVIDER: Hoy Register, MD   PT End of Session - 09/13/21 1457     Visit Number 4    Number of Visits 8    Date for PT Re-Evaluation 10/15/21    Authorization Type CAFA    PT Start Time 1457    PT Stop Time 1530    PT Time Calculation (min) 33 min    Activity Tolerance Patient limited by pain    Behavior During Therapy [facility] for tasks assessed/performed               Past Medical History:  Diagnosis Date   Foraminal stenosis of cervical region 01/03/2021   History reviewed. No pertinent surgical history. 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    REFERRING PROVIDER: Hoy Register, MD   REFERRING DIAG: Musculoskeletal back pain, Chronic pain of right knee  THERAPY DIAG:  Cervicalgia  Muscle weakness (generalized)  Chronic right shoulder pain  Chronic pain of right knee  PERTINENT HISTORY: None  PRECAUTIONS: None  SUBJECTIVE: Patient reports he is doing as good as he can. He states that he could barely move his right arm for a couple days due to pain since last visit. Patient also notes that his knee looks like it is swollen and it feels like someone is cutting it with a razor blade.  PAIN:  Are you having pain? Yes NPRS scale: 7/10 Pain location: Back, Neck (left hand radiating pain) Pain orientation: Left  PAIN TYPE: Chronic Pain description: intermittent, aching, tightness, sharp popping Aggravating factors: Sleeping on left side, turning head to the left while driving, grasping or lifting objects with left hand Relieving factors: Medication   Are you having pain? Yes NPRS scale: 7/10 Pain location: Knee   Pain orientation: Right PAIN TYPE: Chronic Pain description: intermittent, aching, sharp Aggravating factors: Walking, bending the knee Relieving factors: Medication   Are you having pain? Yes NPRS scale: 7/10 Pain location: Shoulder Pain orientation: Right PAIN TYPE: Chronic Pain description: intermittent, sharp Aggravating factors: Using the right arm, laying on right side, night pain Relieving factors: Medication  PATIENT GOALS: Want to go back to work and be able lift objects   OBJECTIVE:  PATIENT SURVEYS:  FOTO 25% functional status  (neck)    POSTURE:  Rounded shoulder and forward head posture   PALPATION: Patient reports tenderness with light palpation to bilateral upper trap regions, cervical paraspinals, suboccipitals, right periacromial region             CERVICAL AROM/PROM   A/PROM A/PROM (deg) 08/20/2021   09/13/2021  Flexion 40   Extension 30   Right lateral flexion 15   Left lateral flexion 5   Right rotation 55 40  Left rotation 35 15    UE AROM/PROM:   A/PROM Right 08/20/2021 Left 08/20/2021 Right 09/13/2021  Shoulder flexion 125 160 Unable  Shoulder abduction 110 160     UE MMT:   MMT Right 08/20/2021 Left 08/20/2021  Shoulder flexion 3- 4  Shoulder abduction 3- 4  Shoulder internal rotation 4 4  Shoulder external rotation 3- 4  Elbow flexion 4 4  Elbow extension 4 4  Wrist flexion 4 4  Wrist extension 4 4  Grip strength* 30 15   LE AROM:   AROM Right 08/30/2021 Left 08/30/2021  Knee  flexion 100 130  Knee extension -3 0   LE MMT:   MMT Right 08/30/2021 Left 08/30/2021  Shoulder flexion - 4  Shoulder abduction - 4    Unable to assess right knee strength due to pain   TODAY'S TREATMENT:  09/13/2021:  Therapeutic Exercise: NuStep L5 x 5 min with UE/LE while taking subjective LTR 5 x 5 sec each Supine cervical rotation 5 x 5 sec each Supine cervical retraction 5 x 5 sec Supine PPT 5 x 5 sec Supine hands grasped shoulder flexion  AAROM - discontinued due to right shoulder pain Supine quad set 5 x 5 sec (right only) Supine SAQ x 10 (right only) - partial range and jerky motion noted Seated shoulder blade squeezes 5 x 5 sec Trialed to perform manual for cervical region and right shoulder consisting of STM and PROM but patient unable to tolerate so discontinued   09/06/2021:  Therapeutic Exercise: NuStep L5 x 5 min with UE/LE while taking subjective SAQ 2 x 10 (right only) - unable to achieve full knee extension Supine shoulder press 2 x 3 each Supine cervical retraction 2 x 5 Supine cervical rotation 2 x 5 each Supine heel slide 2 x 3 (right only) Side clamshell 2 x 5 (right only) Sidelying shoulder ER 2 x 5 (right only) LAQ partial range 2 x 5 (right only) Overhead pulleys shoulder flexion x 4 min  - partial range  Row machine with vertical handles 20# x 10 - partial range Wall walk shoulder flexion x 5 (right only)  08/30/2021:  Therapeutic Exercise: Supine heel slide 2 x 3 Supine quad set 2 x 10 with small towel roll under knee Supine shoulder flexion AAROM with hands grasped 2 x 3 Seated shoulder blade squeeze 5 x 5 sec Trialed performing but patient unable to tolerate: Supine shoulder dowel press  Supine chin tuck Supine cervical rotation MHP post-therapy for low back and neck x 5 min   PATIENT EDUCATION:  Education details: HEP Person educated: Patient Education method: Programmer, multimedia, Facilities manager, Actor cues, Verbal cues Education comprehension: verbalized understanding, returned demonstration, verbal cues required, tactile cues required, and needs further education   HOME EXERCISE PROGRAM: Access Code: [email or web address]     ASSESSMENT: CLINICAL IMPRESSION: Patient with poor tolerance for therapy this visit due to high levels of left sided neck, back, right shoulder, and right knee pain. He arrived late to therapy so limited in therapy due to time constraint. Trial of manual therapy for neck and  shoulder, and right shoulder isometrics this visit but patient unable to tolerate due to pain. He was unwilling to actively move the entire right arm stating that it would kill his shoulder, and demonstrates worsening of cervical motion. Therapy continues to perform light intensity exercise focused on motion and muscle activation as he is unable to tolerate progression to any strengthening. No change to HEP this visit. Patient has one more appointment scheduled so will reassess and likely place PT on hold of he continues to report worsening symptoms and refer him back to the doctor. Patient would benefit from continued skilled PT to progress his mobility and strength in order to reduce pain and maximize functional ability.  Objective impairments include Abnormal gait, decreased activity tolerance, decreased ROM, decreased strength, impaired sensation, postural dysfunction, and pain.     GOALS: Goals reviewed with patient? No   SHORT TERM GOALS:   STG  Name Target Date Goal status  1 Patient will be I with initial HEP in order to progress with therapy. Baseline: provided at eval 09/17/2021 INITIAL  2 PT will review FOTO with patient by 3rd visit in order to understand expected progress and outcome with therapy. Baseline: assessed at eval 09/17/2021 INITIAL  3 Patient will report pain level of neck, right shoulder, and right knee </= 6/10 in order to reduce functional limitations Baseline: pain level 7-8/19 09/17/2021 INITIAL    LONG TERM GOALS:    LTG Name Target Date Goal status  1 Patient will be I with final HEP to maintain progress from PT. Baseline: 10/15/2021 INITIAL  2 Patient will report >/= 46% status on FOTO to indicate improved functional ability. Baseline: 25% functional status 10/15/2021 INITIAL  3 Patient will demonstrate left cervical rotation >/= 55 deg bilaterally to improve driving Baseline: 35 deg 03/17/7168 INITIAL  4 Patient will demonstrate right shoulder elevation >/= 150 deg to  improve overhead reach and household tasks Baseline: 125 deg 10/15/2021 INITIAL  5 Patient will report pain level </= 5/10 in order to reduce functional limitations Baseline: 7-8/10 pain 10/15/2021 INITIAL     PLAN: PT FREQUENCY: 1x/week   PT DURATION: 8 weeks   PLANNED INTERVENTIONS: Therapeutic exercises, Therapeutic activity, Neuro Muscular re-education, Balance training, Gait training, Patient/Family education, Joint mobilization, Aquatic Therapy, Dry Needling, Electrical stimulation, Spinal mobilization, Cryotherapy, Moist heat, Taping, Traction, and Manual therapy   PLAN FOR NEXT SESSION: Review HEP and progress PRN, assess right knee pain further and treat PRN, manual/dry needling for cervical regions, trial SNAG for cervical rotation, postural and rotator cuff strengthening    Rosana Hoes, PT, DPT, LAT, ATC 09/13/21  3:30 PM Phone: [phone] Fax: [phone]

## 2021-09-13 ENCOUNTER — Other Ambulatory Visit: Payer: Self-pay

## 2021-09-13 ENCOUNTER — Ambulatory Visit: Payer: Self-pay | Attending: Family Medicine | Admitting: Physical Therapy

## 2021-09-13 ENCOUNTER — Encounter: Payer: Self-pay | Admitting: Physical Therapy

## 2021-09-13 DIAGNOSIS — M25561 Pain in right knee: Secondary | ICD-10-CM | POA: Insufficient documentation

## 2021-09-13 DIAGNOSIS — M542 Cervicalgia: Secondary | ICD-10-CM | POA: Insufficient documentation

## 2021-09-13 DIAGNOSIS — G8929 Other chronic pain: Secondary | ICD-10-CM | POA: Insufficient documentation

## 2021-09-13 DIAGNOSIS — M25511 Pain in right shoulder: Secondary | ICD-10-CM | POA: Insufficient documentation

## 2021-09-13 DIAGNOSIS — M6281 Muscle weakness (generalized): Secondary | ICD-10-CM | POA: Insufficient documentation

## 2021-09-20 ENCOUNTER — Encounter: Payer: Self-pay | Admitting: Physical Therapy

## 2021-09-20 ENCOUNTER — Ambulatory Visit: Payer: Self-pay | Admitting: Physical Therapy

## 2021-09-20 ENCOUNTER — Other Ambulatory Visit: Payer: Self-pay

## 2021-09-20 DIAGNOSIS — M6281 Muscle weakness (generalized): Secondary | ICD-10-CM

## 2021-09-20 DIAGNOSIS — G8929 Other chronic pain: Secondary | ICD-10-CM

## 2021-09-20 DIAGNOSIS — M542 Cervicalgia: Secondary | ICD-10-CM

## 2021-09-20 NOTE — Therapy (Signed)
OUTPATIENT PHYSICAL THERAPY TREATMENT NOTE  DISCHARGE   Patient Name: Nicholas Roth MRN: [id number] DOB:1960-12-18, 61 y.o., male Today's Date: 09/20/2021  PCP: Hoy Register, MD REFERRING PROVIDER: Hoy Register, MD   PT End of Session - 09/20/21 1453     Visit Number 5    Number of Visits 8    Date for PT Re-Evaluation 10/15/21    Authorization Type CAFA    PT Start Time 1448    PT Stop Time 1528    PT Time Calculation (min) 40 min    Activity Tolerance Patient limited by pain    Behavior During Therapy [facility] for tasks assessed/performed                Past Medical History:  Diagnosis Date   Foraminal stenosis of cervical region 01/03/2021   History reviewed. No pertinent surgical history. 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    REFERRING PROVIDER: Hoy Register, MD   REFERRING DIAG: Musculoskeletal back pain, Chronic pain of right knee  THERAPY DIAG:  Cervicalgia  Muscle weakness (generalized)  Chronic right shoulder pain  Chronic pain of right knee  PERTINENT HISTORY: None  PRECAUTIONS: None  SUBJECTIVE: Patient arrives reporting that he won't be able to do any shoulder exercises today because his right shoulder is too pain. He states he is not doing well, continues to have severe pain. He almost went to the ER last night due to pain. He notes locking of his right knee at night when he bends the knee and then tries to straighten it out.  PAIN:  Are you having pain? Yes NPRS scale: 7/10 Pain location: Back, Neck (left hand radiating pain) Pain orientation: Left  PAIN TYPE: Chronic Pain description: intermittent, aching, tightness, sharp popping Aggravating factors: Sleeping on left side, turning head to the left while driving, grasping or lifting objects with left  hand Relieving factors: Medication   Are you having pain? Yes NPRS scale: 8/10 Pain location: Knee  Pain orientation: Right PAIN TYPE: Chronic Pain description: intermittent, aching, sharp Aggravating factors: Walking, bending the knee Relieving factors: Medication   Are you having pain? Yes NPRS scale: 10/10 Pain location: Shoulder Pain orientation: Right PAIN TYPE: Chronic Pain description: intermittent, sharp Aggravating factors: Using the right arm, laying on right side, night pain Relieving factors: Medication  PATIENT GOALS: Want to go back to work and be able lift objects   OBJECTIVE:  PATIENT SURVEYS:  FOTO 55% functional status (neck) (25% at evaluation)    POSTURE:  Rounded shoulder and forward head posture   PALPATION: Patient reports tenderness with light palpation to bilateral upper trap regions, cervical paraspinals, suboccipitals, right periacromial region             CERVICAL AROM/PROM   A/PROM A/PROM (deg) 08/20/2021   09/13/2021   09/20/2021  Flexion 40  40  Extension 30  25  Right lateral flexion 15  10  Left lateral flexion 5  5  Right rotation 55 40 45  Left rotation 35 15 25    UE AROM/PROM:   A/PROM Right 08/20/2021 Left 08/20/2021 Right 09/13/2021 Right 09/20/2021  Shoulder flexion 125 160 Unable Unable  Shoulder abduction 110 160      UE MMT:    Unable to assess this visit due to pain - 09/20/2021  MMT Right 08/20/2021 Left 08/20/2021  Shoulder flexion 3- 4  Shoulder abduction 3- 4  Shoulder internal rotation 4 4  Shoulder external rotation 3- 4  Elbow flexion 4 4  Elbow extension 4 4  Wrist flexion 4 4  Wrist extension 4 4  Grip strength* 30 15   LE AROM:   AROM Right 08/30/2021 Left 08/30/2021 Right 09/20/2021  Knee  flexion 100 130 90  Knee extension -3 0 -3   LE MMT:   Unable to assess right knee strength due to pain - 09/20/2021  MMT Right 08/30/2021 Left 08/30/2021  Shoulder flexion - 4  Shoulder abduction - 4       TODAY'S TREATMENT:  09/20/2021:  Therapeutic Exercise: NuStep L2 x 5 min with LE while taking subjective - patient refused to use UE due to pain LTR 2 x 5 with 5 sec each Supine cervical rotation 2 x 5 with 5 sec each Supine cervical retraction 2 x 5 with 5 sec Supine quad set 2 x 5 with 5 sec (right only) Seated shoulder blade squeezes 5 x 5 sec Trialed to perform manual for cervical region and right shoulder consisting of STM and PROM but patient unable to tolerate so discontinued MHP applied to right shoulder for pain relief   09/13/2021:  Therapeutic Exercise: NuStep L5 x 5 min with UE/LE while taking subjective LTR 5 x 5 sec each Supine cervical rotation 5 x 5 sec each Supine cervical retraction 5 x 5 sec Supine PPT 5 x 5 sec Supine hands grasped shoulder flexion AAROM - discontinued due to right shoulder pain Supine quad set 5 x 5 sec (right only) Supine SAQ x 10 (right only) - partial range and jerky motion noted Seated shoulder blade squeezes 5 x 5 sec Trialed to perform manual for cervical region and right shoulder consisting of STM and PROM but patient unable to tolerate so discontinued  09/06/2021:  Therapeutic Exercise: NuStep L5 x 5 min with UE/LE while taking subjective SAQ 2 x 10 (right only) - unable to achieve full knee extension Supine shoulder press 2 x 3 each Supine cervical retraction 2 x 5 Supine cervical rotation 2 x 5 each Supine heel slide 2 x 3 (right only) Side clamshell 2 x 5 (right only) Sidelying shoulder ER 2 x 5 (right only) LAQ partial range 2 x 5 (right only) Overhead pulleys shoulder flexion x 4 min  - partial range  Row machine with vertical handles 20# x 10 - partial range Wall walk shoulder flexion x 5 (right only)   PATIENT EDUCATION:  Education details: POC discharge, HEP Person educated: Patient Education method: Programmer, multimedia, Demonstration, Tactile cues, Verbal cues Education comprehension: verbalized understanding, returned  demonstration, verbal cues required, tactile cues required, and needs further education   HOME EXERCISE PROGRAM: Access Code: [email or web address]     ASSESSMENT: CLINICAL IMPRESSION: Patient with poor tolerance for therapy this visit due to high levels of left sided neck, back, right shoulder, and right knee pain. He refused any shoulder exercise this visit. He continues to demonstrate significant limitation with cervical, right shoulder, and right knee pain so will discharge patient from therapy due to lack of progress and continued high levels of pain. Patient instructed to follow up with his provider due to ongoing pain.  Objective impairments include Abnormal gait, decreased activity tolerance, decreased ROM, decreased strength, impaired sensation, postural dysfunction, and pain.     GOALS: Goals reviewed with patient? No   SHORT TERM GOALS:   STG Name Target Date Goal status  1 Patient will be I with initial HEP in order to progress with therapy. Baseline: provided at eval 09/20/2021: unable to perform 09/17/2021 NOT MET  2 PT will review FOTO with patient by 3rd visit in order to understand expected progress and outcome with therapy. Baseline: assessed at eval 09/20/2021: reviewed and reassessed 09/17/2021 MET  3 Patient will report pain level of neck, right shoulder, and right knee </= 6/10 in order to reduce functional limitations Baseline: pain level 7-8/19 09/20/2021: 7-10/10 pain 09/17/2021 NOT MET    LONG TERM GOALS:    LTG Name Target Date Goal status  1 Patient will be I with final HEP to maintain progress from PT. Baseline: 09/20/2021: unable to perform 10/15/2021 NOT MET  2 Patient will report >/= 46% status on FOTO to indicate improved functional ability. Baseline: 25% functional status 09/20/2021: 55% 10/15/2021 MET  3 Patient will demonstrate left cervical rotation >/= 55 deg bilaterally to improve driving Baseline: 35 deg 09/20/2021: 25 DEG 10/15/2021 NOT MET  4 Patient will demonstrate  right shoulder elevation >/= 150 deg to improve overhead reach and household tasks Baseline: 125 deg 09/20/2021: unable to perform due to pain 10/15/2021 NOT MET  5 Patient will report pain level </= 5/10 in order to reduce functional limitations Baseline: 7-8/10 pain 09/20/2021: 9-10/10 pain 10/15/2021 NOT MET     PLAN: PT FREQUENCY: 1x/week   PT DURATION: 8 weeks   PLANNED INTERVENTIONS: Therapeutic exercises, Therapeutic activity, Neuro Muscular re-education, Balance training, Gait training, Patient/Family education, Joint mobilization, Aquatic Therapy, Dry Needling, Electrical stimulation, Spinal mobilization, Cryotherapy, Moist heat, Taping, Traction, and Manual therapy   PLAN FOR NEXT SESSION: NA - discharge    Hilda Blades, PT, DPT, LAT, ATC 09/20/21  3:34 PM Phone: [phone] Fax: [phone]     PHYSICAL THERAPY DISCHARGE SUMMARY  Visits from Start of Care: 5  Current functional level related to goals / functional outcomes: See above   Remaining deficits: See above   Education / Equipment: HEP   Patient agrees to discharge. Patient goals were not met. Patient is being discharged due to lack of progress.

## 2021-10-09 ENCOUNTER — Ambulatory Visit (INDEPENDENT_AMBULATORY_CARE_PROVIDER_SITE_OTHER): Payer: Self-pay | Admitting: Orthopaedic Surgery

## 2021-10-09 ENCOUNTER — Other Ambulatory Visit: Payer: Self-pay

## 2021-10-09 ENCOUNTER — Encounter: Payer: Self-pay | Admitting: Orthopaedic Surgery

## 2021-10-09 VITALS — BP 146/100 | HR 89 | Ht 68.0 in | Wt 165.0 lb

## 2021-10-09 DIAGNOSIS — M4802 Spinal stenosis, cervical region: Secondary | ICD-10-CM

## 2021-10-09 DIAGNOSIS — M4722 Other spondylosis with radiculopathy, cervical region: Secondary | ICD-10-CM

## 2021-10-09 NOTE — Progress Notes (Signed)
? ?Office Visit Note ?  ?Patient: Nicholas Roth [facility]           ?Date of Birth: Oct 20, 1960           ?MRN: [id number] ?Visit Date: 10/09/2021 ?             ?Requested by: Hoy Register, MD ?[address] ?[address] ?[place],  [place] [address] ?PCP: Hoy Register, MD ? ? ?Assessment & Plan: ?Visit Diagnoses:  ?1. Other spondylosis with radiculopathy, cervical region   ?2. Foraminal stenosis of cervical region   ? ? ?Plan: MRI scan reviewed with patient and his wife.  He is sore from the fall last week and x-rays from the ER of his shoulder and neck were reviewed and discussed.  He is improved some since last week and should have progressive improvement in his soreness fall prevention discussed. ? ?Follow-Up Instructions: No follow-ups on file.  ? ?Orders:  ?No orders of the defined types were placed in this encounter. ? ?No orders of the defined types were placed in this encounter. ? ? ? ? Procedures: ?No procedures performed ? ? ?Clinical Data: ?No additional findings. ? ? ?Subjective: ?Chief Complaint  ?Patient presents with  ? Right Arm - Pain  ?  Fall 10/02/2021  ? Right Leg - Pain  ?  Fall 10/02/2021  ? ? ?HPI 61 year old male here with his wife fell last week on 10/02/2021 in the bathroom states his leg gave way.  He is ambulating with a cane but has been switching hands.  States she has had some pain on the right side of his neck since he fell and pain in the right arm.  Pain down his right leg.  He has been on Robaxin Mobic and Cymbalta which have not helped.  He states he feels like his right leg is weak.  Previous MRI January 2023 showed multilevel spondylosis with foraminal encroachment bilaterally at C5-6 and moderate to severe foraminal encroachment at C7-T1 unchanged from previous scans.  Patient's been on Neurontin . ? ?Review of Systems positive smoker he has tried NicoDerm patch in the past. ? ? ?Objective: ?Vital Signs: BP (!) 146/100 (BP Location: Right Arm)   Pulse 89   Ht 5\' 8"  (1.727 m)    Wt 165 lb (74.8 kg)   BMI 25.09 kg/m?  ? ?Physical Exam ?Constitutional:   ?   Appearance: He is well-developed.  ?HENT:  ?   Head: Normocephalic and atraumatic.  ?   Right Ear: External ear normal.  ?   Left Ear: External ear normal.  ?Eyes:  ?   Pupils: Pupils are equal, round, and reactive to light.  ?Neck:  ?   Thyroid: No thyromegaly.  ?   Trachea: No tracheal deviation.  ?Cardiovascular:  ?   Rate and Rhythm: Normal rate.  ?Pulmonary:  ?   Effort: Pulmonary effort is normal.  ?   Breath sounds: No wheezing.  ?Abdominal:  ?   General: Bowel sounds are normal.  ?   Palpations: Abdomen is soft.  ?Musculoskeletal:  ?   Cervical back: Neck supple.  ?Skin: ?   General: Skin is warm and dry.  ?   Capillary Refill: Capillary refill takes less than 2 seconds.  ?Neurological:  ?   Mental Status: He is alert and oriented to person, place, and time.  ?Psychiatric:     ?   Behavior: Behavior normal.     ?   Thought Content: Thought content normal.     ?  Judgment: Judgment normal.  ? ? ?Ortho Exam patient has intact knee and ankle jerk.  Upper extremity reflexes are 2+ no thenar or hypothenar atrophy.  He can ambulate and switch is obtained back in for sometimes using it when he puts his right foot down sometimes out of sequence. ? ?Specialty Comments:  ?No specialty comments available. ? ?Imaging: ?No results found. ? ? ?PMFS History: ?Patient Active Problem List  ? Diagnosis Date Noted  ? Other spondylosis with radiculopathy, cervical region 08/08/2021  ? Tobacco abuse 01/11/2021  ? Foraminal stenosis of cervical region 01/03/2021  ? ?Past Medical History:  ?Diagnosis Date  ? Foraminal stenosis of cervical region 01/03/2021  ?  ?Family History  ?Problem Relation Age of Onset  ? CAD Father   ?  ?No past surgical history on file. ?Social History  ? ?Occupational History  ? Not on file  ?Tobacco Use  ? Smoking status: Every Day  ? Smokeless tobacco: Not on file  ?Substance and Sexual Activity  ? Alcohol use: Yes  ? Drug  use: Yes  ?  Types: Marijuana, Cocaine  ? Sexual activity: Not on file  ? ? ? ? ? ? ?

## 2021-10-18 ENCOUNTER — Ambulatory Visit: Payer: Self-pay | Admitting: Physician Assistant

## 2021-10-18 ENCOUNTER — Encounter: Payer: Self-pay | Admitting: Physician Assistant

## 2021-10-18 ENCOUNTER — Ambulatory Visit: Payer: Self-pay | Attending: Physician Assistant | Admitting: Physician Assistant

## 2021-10-18 VITALS — BP 130/85 | HR 78 | Wt 164.2 lb

## 2021-10-18 DIAGNOSIS — M5412 Radiculopathy, cervical region: Secondary | ICD-10-CM

## 2021-10-18 DIAGNOSIS — R7989 Other specified abnormal findings of blood chemistry: Secondary | ICD-10-CM

## 2021-10-18 DIAGNOSIS — R739 Hyperglycemia, unspecified: Secondary | ICD-10-CM

## 2021-10-18 DIAGNOSIS — R202 Paresthesia of skin: Secondary | ICD-10-CM

## 2021-10-18 NOTE — Progress Notes (Signed)
? ?Kayler Rise, is a 61 y.o. male ? ?CBU:[id number] ? ?EHO:[id number] ? ?DOB - [birth year] ? ?No chief complaint on file. ?    ? ?Subjective:  ? ?Eldin Bonsell is a 61 y.o. male here today for a follow up visit.  He has been being followed by ortho and PT for various musculoskeletal concerns-he was most recently seen 10/09/2021. He says he is here for blood work and does not need RF currently.  He continues to have paresthesias in his L arm that he has been having a long time ? ?Patient has No headache, No chest pain, No abdominal pain - No Nausea, No Cough - SOB. ? ?No problems updated. ? ?ALLERGIES: ?No Known Allergies ? ?PAST MEDICAL HISTORY: ?Past Medical History:  ?Diagnosis Date  ? Foraminal stenosis of cervical region 01/03/2021  ? ? ?MEDICATIONS AT HOME: ?Prior to Admission medications   ?Medication Sig Start Date End Date Taking? Authorizing Provider  ?acetaminophen (TYLENOL) 500 MG tablet Take 500 mg by mouth every 6 (six) hours as needed for moderate pain. ?Patient not taking: Reported on 04/11/2021    [provider]  ?ALPRAZolam Prudy Feeler) 1 MG tablet Take one tablet 30 min prior to MRI ?Patient not taking: Reported on 01/11/2021 10/25/20   Storm Frisk, MD  ?diazepam (VALIUM) 5 MG tablet Take 2 tablets 1 hr before MRI scan. Take extra tablet with you ?Patient not taking: Reported on 08/08/2021 07/25/21   Eldred Manges, MD  ?DULoxetine (CYMBALTA) 60 MG capsule Take 1 capsule (60 mg total) by mouth daily. 01/11/21   Hoy Register, MD  ?gabapentin (NEURONTIN) 300 MG capsule TAKE 1 CAPSULE (300 MG TOTAL) BY MOUTH 2 (TWO) TIMES DAILY. ?Patient not taking: Reported on 04/11/2021 10/12/20 10/12/21  Hoy Register, MD  ?lidocaine (LIDODERM) 5 % Place 1 patch onto the skin daily as needed. Apply patch to area most significant pain once per day.  Remove and discard patch within 12 hours of application. ?Patient not taking: Reported on 04/11/2021 01/11/21   Hoy Register, MD  ?metaxalone (SKELAXIN)  800 MG tablet Take 1 tablet (800 mg total) by mouth 3 (three) times daily. ?Patient not taking: Reported on 04/11/2021 10/12/20   Hoy Register, MD  ?nicotine (NICODERM CQ) 14 mg/24hr patch Place 1 patch (14 mg total) onto the skin daily. ?Patient not taking: Reported on 04/11/2021 01/11/21   Hoy Register, MD  ? ? ?ROS: ?Neg HEENT ?Neg resp ?Neg cardiac ?Neg GI ?Neg GU ?Neg psych ?Neg neuro ? ?Objective:  ? ?Vitals:  ? 10/18/21 1434  ?BP: 130/85  ?Pulse: 78  ?SpO2: 96%  ?Weight: 164 lb 3.2 oz (74.5 kg)  ? ?Exam ?General appearance : Awake, alert, not in any distress. Speech Clear. Not toxic looking;  poor historian-difficult to follow-he is not exactly sure why he is here ?HEENT: Atraumatic and Normocephalic ?Neck: Supple, no JVD. No cervical lymphadenopathy.  ?Chest: Good air entry bilaterally, CTAB.  No rales/rhonchi/wheezing ?CVS: S1 S2 regular, no murmurs.  ?Extremities: B/L Lower Ext shows no edema, both legs are warm to touch ?Neurology: Awake alert, and oriented X 3, CN II-XII intact, Non focal ?Skin: No Rash ? ?Data Review ?Lab Results  ?Component Value Date  ? HGBA1C 6.0 (A) 10/12/2020  ? ? ?Assessment & Plan  ? ?1. Elevated LFTs ?Avoid drinking alcohol/beer/wine for overall health ?- Comprehensive metabolic panel ? ?2. Cervical radiculopathy ?Followed by ortho ? ?3. Paresthesia ?- Thyroid Panel With TSH ?- Vitamin D, 25-hydroxy ? ?4. Hyperglycemia ?I have had  a lengthy discussion and provided education about insulin resistance and the intake of too much sugar/refined carbohydrates.  I have advised the patient to work at a goal of eliminating sugary drinks, candy, desserts, sweets, refined sugars, processed foods, and white carbohydrates.  The patient expresses understanding.  ?- Hemoglobin A1c ? ? ? ?Patient have been counseled extensively about nutrition and exercise. Other issues discussed during this visit include: low cholesterol diet, weight control and daily exercise, foot care, annual eye  examinations at Ophthalmology, importance of adherence with medications and regular follow-up. We also discussed long term complications of uncontrolled diabetes and hypertension.  ? ?Return in about 6 months (around 04/19/2022) for PCP/chronic conditions. ? ?The patient was given clear instructions to go to ER or return to medical center if symptoms don't improve, worsen or new problems develop. The patient verbalized understanding. The patient was told to call to get lab results if they haven't heard anything in the next week.  ? ? ? ? ?Georgian Co, PA-C ?[facility] ?[place], [place] ?[phone]   ?10/18/2021, 3:13 PM Patient ID: MYKAEL BATZ, male   DOB: 1960-09-26, 61 y.o.   MRN: [id number] ? ?

## 2021-10-19 ENCOUNTER — Other Ambulatory Visit: Payer: Self-pay | Admitting: Physician Assistant

## 2021-10-19 DIAGNOSIS — R7989 Other specified abnormal findings of blood chemistry: Secondary | ICD-10-CM

## 2021-10-19 LAB — COMPREHENSIVE METABOLIC PANEL
ALT: 123 IU/L — ABNORMAL HIGH (ref 0–44)
AST: 134 IU/L — ABNORMAL HIGH (ref 0–40)
Albumin/Globulin Ratio: 0.9 — ABNORMAL LOW (ref 1.2–2.2)
Albumin: 4 g/dL (ref 3.8–4.9)
Alkaline Phosphatase: 60 IU/L (ref 44–121)
BUN/Creatinine Ratio: 13 (ref 9–20)
BUN: 13 mg/dL (ref 6–24)
Bilirubin Total: 0.8 mg/dL (ref 0.0–1.2)
CO2: 23 mmol/L (ref 20–29)
Calcium: 9.3 mg/dL (ref 8.7–10.2)
Chloride: 97 mmol/L (ref 96–106)
Creatinine, Ser: 1 mg/dL (ref 0.76–1.27)
Globulin, Total: 4.7 g/dL — ABNORMAL HIGH (ref 1.5–4.5)
Glucose: 96 mg/dL (ref 70–99)
Potassium: 3.8 mmol/L (ref 3.5–5.2)
Sodium: 139 mmol/L (ref 134–144)
Total Protein: 8.7 g/dL — ABNORMAL HIGH (ref 6.0–8.5)
eGFR: 87 mL/min/{1.73_m2} (ref >59)

## 2021-10-19 LAB — THYROID PANEL WITH TSH
Free Thyroxine Index: 2.3 (ref 1.2–4.9)
T3 Uptake Ratio: 26 % (ref 24–39)
T4, Total: 9 ug/dL (ref 4.5–12.0)
TSH: 1.79 u[IU]/mL (ref 0.450–4.500)

## 2021-10-19 LAB — HEMOGLOBIN A1C
Est. average glucose Bld gHb Est-mCnc: 128 mg/dL
Hgb A1c MFr Bld: 6.1 % — ABNORMAL HIGH (ref 4.8–5.6)

## 2021-10-19 LAB — VITAMIN D 25 HYDROXY (VIT D DEFICIENCY, FRACTURES): Vit D, 25-Hydroxy: 8.2 ng/mL — ABNORMAL LOW (ref 30.0–100.0)

## 2021-10-19 MED ORDER — VITAMIN D (ERGOCALCIFEROL) 1.25 MG (50000 UNIT) PO CAPS: 50000.0000 [IU] | ORAL_CAPSULE | ORAL | 0 refills | Status: AC

## 2021-11-28 ENCOUNTER — Ambulatory Visit: Payer: Self-pay | Admitting: Physician Assistant

## 2022-04-22 ENCOUNTER — Encounter: Payer: Self-pay | Admitting: Family Medicine

## 2022-04-22 ENCOUNTER — Ambulatory Visit: Payer: Medicaid Other | Attending: Family Medicine | Admitting: Family Medicine

## 2022-04-22 ENCOUNTER — Other Ambulatory Visit: Payer: Self-pay

## 2022-04-22 VITALS — BP 113/79 | HR 77 | Ht 68.0 in | Wt 159.4 lb

## 2022-04-22 DIAGNOSIS — M4802 Spinal stenosis, cervical region: Secondary | ICD-10-CM | POA: Diagnosis not present

## 2022-04-22 DIAGNOSIS — F32A Depression, unspecified: Secondary | ICD-10-CM

## 2022-04-22 DIAGNOSIS — K0889 Other specified disorders of teeth and supporting structures: Secondary | ICD-10-CM

## 2022-04-22 DIAGNOSIS — Z1211 Encounter for screening for malignant neoplasm of colon: Secondary | ICD-10-CM

## 2022-04-22 DIAGNOSIS — M25511 Pain in right shoulder: Secondary | ICD-10-CM

## 2022-04-22 DIAGNOSIS — G8929 Other chronic pain: Secondary | ICD-10-CM

## 2022-04-22 DIAGNOSIS — Z1159 Encounter for screening for other viral diseases: Secondary | ICD-10-CM

## 2022-04-22 DIAGNOSIS — M5412 Radiculopathy, cervical region: Secondary | ICD-10-CM | POA: Diagnosis not present

## 2022-04-22 DIAGNOSIS — F419 Anxiety disorder, unspecified: Secondary | ICD-10-CM

## 2022-04-22 DIAGNOSIS — R7989 Other specified abnormal findings of blood chemistry: Secondary | ICD-10-CM

## 2022-04-22 MED ORDER — LIDOCAINE 5 % EX PTCH
1.0000 | MEDICATED_PATCH | Freq: Every day | CUTANEOUS | 3 refills | Status: DC | PRN
  Filled 2022-04-22: qty 30, 30d supply, fill #0
  Filled 2022-07-23 – 2022-10-22 (×2): qty 30, 30d supply, fill #1

## 2022-04-22 MED ORDER — GABAPENTIN 300 MG PO CAPS
300.0000 mg | ORAL_CAPSULE | Freq: Two times a day (BID) | ORAL | 3 refills | Status: DC
  Filled 2022-04-22: qty 60, 30d supply, fill #0

## 2022-04-22 MED ORDER — METAXALONE 800 MG PO TABS
800.0000 mg | ORAL_TABLET | Freq: Three times a day (TID) | ORAL | 3 refills | Status: DC
  Filled 2022-04-22: qty 90, 30d supply, fill #0
  Filled 2022-10-22: qty 90, 30d supply, fill #1

## 2022-04-22 MED ORDER — DULOXETINE HCL 60 MG PO CPEP
60.0000 mg | ORAL_CAPSULE | Freq: Every day | ORAL | 3 refills | Status: DC
  Filled 2022-04-22: qty 30, 30d supply, fill #0

## 2022-04-22 NOTE — Progress Notes (Signed)
Subjective:  Patient ID: Nicholas Roth, male    DOB: 08/17/1960  Age: 61 y.o. MRN: [id number]  CC: Follow-up (F/u. Dental pain x 2 mo.  /Meds are causing aggitation, nervousness. Discuss lidocaine patches for neck. /Pain in neck, R arm. R leg./No flu vax. )   HPI Nicholas Roth is a 61 y.o. year old male with a history of cervical radiculopathy, prediabetes (A1c 6.0) here for a follow up visit  Interval History:  Complains of knots in his left hand and cramping in his left hand, sometimes drops things from that hand. He has pain in his neck which radiates down his left hand. Also complains of right shoulder pain and difficulty with range of motion.  Sometimes he feels pain from his neck all the way down his right upper extremity and down to his right lower extremity. Seen by orthopedic Dr. Lorin Mercy early in the year.    MRI C-spine from 07/2021 revealed: IMPRESSION: Multilevel cervical spondylosis. Multilevel spinal and foraminal stenosis as above   Progressive spurring and foraminal encroachment bilaterally at C5-6 and C6-7. Moderate to severe left foraminal encroachment C7-T1 unchanged.   Pain causes him to be depressed coupled with his homeless situation and he states 2 months ago he did have a suicidal thoughts and plan.   Willette Cluster who is his POA presents with his visit with him and noticed him jerking which she thought might be secondary to his medication so he discontinued all his medications.  He has completed PT with no improvement in symptoms. She states orthopedic had planned to proceed with surgery but when they discovered he did not have medical coverage stated he was fine. He is unable to work and is trying to apply for disability.  Has been to see legal aid but they have not made much progress.  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  No pertinent surgical history.  Family History  Problem Relation Age of Onset   CAD Father     Social History   Socioeconomic History   Marital status: Single    Spouse name: Not on file   Number of children: Not on file   Years of education: Not on file   Highest education level: Not on file  Occupational History   Not on file  Tobacco Use   Smoking status: Every Day   Smokeless tobacco: Not on file  Substance and Sexual Activity   Alcohol use: Yes   Drug use: Yes    Types: Marijuana, Cocaine   Sexual activity: Not on file  Other Topics Concern   Not on file  Social History Narrative   Not on file   Social Determinants of Health   Financial Resource Strain: Not on file  Food Insecurity: Not on file  Transportation Needs: Not on file  Physical Activity: Not on file  Stress: Not on file  Social Connections: Not on file    No Known Allergies  Outpatient Medications Prior to Visit  Medication Sig Dispense Refill   acetaminophen (TYLENOL) 500 MG tablet Take 500 mg by mouth every 6 (six) hours as needed for moderate pain.     ALPRAZolam (XANAX) 1 MG tablet Take one tablet 30 min prior to MRI 1 tablet 0   diazepam (VALIUM) 5 MG tablet Take 2 tablets 1 hr before MRI scan. Take extra tablet with  you 3 tablet 0   nicotine (NICODERM CQ) 14 mg/24hr patch Place 1 patch (14 mg total) onto the skin daily. (Patient not taking: Reported on 04/22/2022) 28 patch 2   Vitamin D, Ergocalciferol, (DRISDOL) 1.25 MG (50000 UNIT) CAPS capsule Take 1 capsule (50,000 Units total) by mouth every 7 (seven) days. (Patient not taking: Reported on 04/22/2022) 16 capsule 0   DULoxetine (CYMBALTA) 60 MG capsule Take 1 capsule (60 mg total) by mouth daily. (Patient not taking: Reported on 04/22/2022) 30 capsule 3   gabapentin (NEURONTIN) 300 MG capsule TAKE 1 CAPSULE (300 MG TOTAL) BY MOUTH 2 (TWO) TIMES DAILY. (Patient not taking: Reported on 04/11/2021) 60 capsule 3   lidocaine (LIDODERM) 5 % Place 1  patch onto the skin daily as needed. Apply patch to area most significant pain once per day.  Remove and discard patch within 12 hours of application. (Patient not taking: Reported on 04/11/2021) 30 patch 3   metaxalone (SKELAXIN) 800 MG tablet Take 1 tablet (800 mg total) by mouth 3 (three) times daily. (Patient not taking: Reported on 04/11/2021) 90 tablet 3   No facility-administered medications prior to visit.     ROS Review of Systems  Constitutional:  Negative for activity change and appetite change.  HENT:  Positive for dental problem. Negative for sinus pressure and sore throat.   Respiratory:  Negative for chest tightness, shortness of breath and wheezing.   Cardiovascular:  Negative for chest pain and palpitations.  Gastrointestinal:  Negative for abdominal distention, abdominal pain and constipation.  Genitourinary: Negative.   Musculoskeletal:        See HPI  Neurological:  Positive for weakness and numbness.  Psychiatric/Behavioral:  Positive for dysphoric mood. Negative for behavioral problems.     Objective:  BP 113/79 (BP Location: Left Arm, Patient Position: Sitting, Cuff Size: Normal)   Pulse 77   Ht 5' 8" (1.727 m)   Wt 159 lb 6.4 oz (72.3 kg)   SpO2 96%   BMI 24.24 kg/m      04/22/2022    2:26 PM 10/18/2021    2:34 PM 10/09/2021    3:25 PM  BP/Weight  Systolic BP 937 169 678  Diastolic BP 79 85 938  Wt. (Lbs) 159.4 164.2 165  BMI 24.24 kg/m2 24.97 kg/m2 25.09 kg/m2      Physical Exam Constitutional:      Appearance: He is well-developed.  Neck:     Comments: Restricted range of motion of cervical spine due to pain Cardiovascular:     Rate and Rhythm: Normal rate.     Heart sounds: Normal heart sounds. No murmur heard. Pulmonary:     Effort: Pulmonary effort is normal.     Breath sounds: Normal breath sounds. No wheezing or rales.  Chest:     Chest wall: No tenderness.  Abdominal:     General: Bowel sounds are normal. There is no distension.      Palpations: Abdomen is soft. There is no mass.     Tenderness: There is no abdominal tenderness.  Musculoskeletal:        General: Normal range of motion.     Cervical back: Tenderness present.     Right lower leg: No edema.     Left lower leg: No edema.     Comments: Wasting of right trapezius muscle with drooping of right shoulder Severely restricted forward elevation of right upper extremity with associated tenderness on palpation of right shoulder joint  Reduced handgrip in both  hands  Neurological:     Mental Status: He is alert and oriented to person, place, and time.  Psychiatric:        Mood and Affect: Mood normal.        Latest Ref Rng & Units 10/18/2021    2:48 PM 01/11/2021   10:02 AM 01/23/2020    9:39 AM  CMP  Glucose 70 - 99 mg/dL 96  88  119   BUN 6 - 24 mg/dL _0 Creatinine 0.76 - 1.27 mg/dL 1.00  0.95  1.03   Sodium 134 - 144 mmol/L 139  143  138   Potassium 3.5 - 5.2 mmol/L 3.8  4.1  3.8   Chloride 96 - 106 mmol/L 97  103  104   CO2 20 - 29 mmol/L _1 Calcium 8.7 - 10.2 mg/dL 9.3  8.8  8.7   Total Protein 6.0 - 8.5 g/dL 8.7  7.4  7.5   Total Bilirubin 0.0 - 1.2 mg/dL 0.8  0.3  0.7   Alkaline Phos 44 - 121 IU/L 60  47  42   AST 0 - 40 IU/L 134  61  87   ALT 0 - 44 IU/L 123  61  79     Lipid Panel  No results found for: "CHOL", "TRIG", "HDL", "CHOLHDL", "VLDL", "LDLCALC", "LDLDIRECT"  CBC    Component Value Date/Time   WBC 5.4 01/11/2021 1002   WBC 6.2 01/23/2020 0939   RBC 4.29 01/11/2021 1002   RBC 4.50 01/23/2020 0939   HGB 13.8 01/11/2021 1002   HCT 40.4 01/11/2021 1002   PLT 225 01/11/2021 1002   MCV 94 01/11/2021 1002   MCH 32.2 01/11/2021 1002   MCH 31.6 01/23/2020 0939   MCHC 34.2 01/11/2021 1002   MCHC 32.6 01/23/2020 0939   RDW 13.7 01/11/2021 1002   LYMPHSABS 2.9 01/11/2021 1002   MONOABS 1.1 (H) 01/23/2020 0939   EOSABS 0.1 01/11/2021 1002   BASOSABS 0.0 01/11/2021 1002    Lab Results  Component Value Date    HGBA1C 6.1 (H) 10/18/2021    Assessment & Plan:  1. Cervical radiculopathy Uncontrolled Referred back to Orthopedic as he might benefit from surgery Advised to obtain paperwork for the [place] discount at the front desk. - lidocaine (LIDODERM) 5 %; Place 1 patch onto the skin daily as needed. Apply patch to area most significant pain once per day.  Remove and discard patch within 12 hours of application.  Dispense: 30 patch; Refill: 3 - gabapentin (NEURONTIN) 300 MG capsule; TAKE 1 CAPSULE (300 MG TOTAL) BY MOUTH 2 (TWO) TIMES DAILY.  Dispense: 60 capsule; Refill: 3 - DULoxetine (CYMBALTA) 60 MG capsule; Take 1 capsule (60 mg total) by mouth daily.  Dispense: 30 capsule; Refill: 3 - metaxalone (SKELAXIN) 800 MG tablet; Take 1 tablet (800 mg total) by mouth 3 (three) times daily.  Dispense: 90 tablet; Refill: 3 - AMB referral to orthopedics  2. Foraminal stenosis of cervical region See #1 above - lidocaine (LIDODERM) 5 %; Place 1 patch onto the skin daily as needed. Apply patch to area most significant pain once per day.  Remove and discard patch within 12 hours of application.  Dispense: 30 patch; Refill: 3  3. Anxiety and depression Uncontrolled due to ongoing chronic pain Restarted Cymbalta Advised to restart medications 1 medication per week so he can monitor for adverse effects Ongoing psychosocial stressors LCSW called in  for evaluation and assistance with resources - DULoxetine (CYMBALTA) 60 MG capsule; Take 1 capsule (60 mg total) by mouth daily.  Dispense: 30 capsule; Refill: 3  4. Chronic right shoulder pain He does have trapezius muscle wasting on the right which is concerning - AMB referral to orthopedics  5. Pain, dental - Ambulatory referral to Dentistry  6. Screening for colon cancer - Fecal occult blood, imunochemical([facility])  7. Screening for viral disease - HIV Antibody (routine testing w rflx)  8. Elevated LFTs - Gamma GT - HCV Ab w  Reflex to Quant PCR - CMP14+EGFR    Meds ordered this encounter  Medications   lidocaine (LIDODERM) 5 %    Sig: Place 1 patch onto the skin daily as needed. Apply patch to area most significant pain once per day.  Remove and discard patch within 12 hours of application.    Dispense:  30 patch    Refill:  3   gabapentin (NEURONTIN) 300 MG capsule    Sig: TAKE 1 CAPSULE (300 MG TOTAL) BY MOUTH 2 (TWO) TIMES DAILY.    Dispense:  60 capsule    Refill:  3   DULoxetine (CYMBALTA) 60 MG capsule    Sig: Take 1 capsule (60 mg total) by mouth daily.    Dispense:  30 capsule    Refill:  3   metaxalone (SKELAXIN) 800 MG tablet    Sig: Take 1 tablet (800 mg total) by mouth 3 (three) times daily.    Dispense:  90 tablet    Refill:  3    Follow-up: Return in about 3 months (around 07/23/2022) for Chronic medical conditions.       Charlott Rakes, MD, FAAFP. [facility] and [facility] [place], [place]   04/22/2022, 4:57 PM

## 2022-04-22 NOTE — Patient Instructions (Signed)
Cervical Radiculopathy  Cervical radiculopathy means that a nerve in the neck (a cervical nerve) is pinched or bruised. This can happen because of an injury to the cervical spine (vertebrae) in the neck, or as a normal part of getting older. This condition can cause pain or loss of feeling (numbness) that runs from your neck all the way down to your arm and fingers. Often, this condition gets better with rest. Treatment may be needed if the condition does not get better. What are the causes? A neck injury. A bulging disk in your spine. Sudden muscle tightening (muscle spasms). Tight muscles in your neck due to overuse. Arthritis. Breakdown in the bones and joints of the spine (spondylosis) due to getting older. Bone spurs that form near the nerves in the neck. What are the signs or symptoms? Pain. The pain may: Run from the neck to the arm and hand. Be very bad or irritating. Get worse when you move your neck. Loss of feeling or tingling in your arm or hand. Weakness in your arm or hand, in very bad cases. How is this treated? In many cases, treatment is not needed for this condition. With rest, the condition often gets better over time. If treatment is needed, options may include: Wearing a soft neck collar (cervical collar) for short periods of time. Doing exercises (physical therapy) to strengthen your neck muscles. Taking medicines. Having shots (injections) in your spine, in very bad cases. Having surgery. This may be needed if other treatments do not help. The type of surgery that is used will depend on the cause of your condition. Follow these instructions at home: If you have a soft neck collar: Wear it as told by your doctor. Take it off only as told by your doctor. Ask your doctor if you can take the collar off for cleaning and bathing. If you are allowed to take the collar off for cleaning or bathing: Follow instructions from your doctor about how to take off the collar  safely. Clean the collar by wiping it with mild soap and water and drying it completely. Take out any removable pads in the collar every 1-2 days. Wash them by hand with soap and water. Let them air-dry completely before you put them back in the collar. Check your skin under the collar for redness or sores. If you see any, tell your doctor. Managing pain     Take over-the-counter and prescription medicines only as told by your doctor. If told, put ice on the painful area. To do this: If you have a soft neck collar, take if off as told by your doctor. Put ice in a plastic bag. Place a towel between your skin and the bag. Leave the ice on for 20 minutes, 2-3 times a day. Take off the ice if your skin turns bright red. This is very important. If you cannot feel pain, heat, or cold, you have a greater risk of damage to the area. If using ice does not help, you can try using heat. Use the heat source that your doctor recommends, such as a moist heat pack or a heating pad. Place a towel between your skin and the heat source. Leave the heat on for 20-30 minutes. Take off the heat if your skin turns bright red. This is very important. If you cannot feel pain, heat, or cold, you have a greater risk of getting burned. You may try a gentle neck and shoulder rub (massage). Activity Rest as needed. Return   to your normal activities when your doctor says that it is safe. Do exercises as told by your doctor or physical therapist. You may have to avoid lifting. Ask your doctor how much you can safely lift. General instructions Use a flat pillow when you sleep. Do not drive while wearing a soft neck collar. If you do not have a soft neck collar, ask your doctor if it is safe to drive while your neck heals. Ask your doctor if you should avoid driving or using machines while you are taking your medicine. Do not smoke or use any products that contain nicotine or tobacco. If you need help quitting, ask your  doctor. Keep all follow-up visits. Contact a doctor if: Your condition does not get better with treatment. Get help right away if: Your pain gets worse and medicine does not help. You lose feeling or feel weak in your hand, arm, face, or leg. You have a high fever. Your neck is stiff. You cannot control when you poop or pee (have incontinence). You have trouble with walking, balance, or talking. Summary Cervical radiculopathy means that a nerve in the neck is pinched or bruised. A nerve can get pinched from a bulging disk, arthritis, an injury to the neck, or other causes. Symptoms include pain, tingling, or loss of feeling that goes from the neck to the arm or hand. Weakness in your arm or hand can happen in very bad cases. Treatment may include resting, wearing a soft neck collar, and doing exercises. You might need to take medicines for pain. In very bad cases, shots or surgery may be needed. This information is not intended to replace advice given to you by your health care provider. Make sure you discuss any questions you have with your health care provider. Document Revised: 01/04/2021 Document Reviewed: 01/04/2021 Elsevier Patient Education  2023 [employer]vier Inc.  

## 2022-04-23 ENCOUNTER — Ambulatory Visit (INDEPENDENT_AMBULATORY_CARE_PROVIDER_SITE_OTHER): Payer: Managed Care, Other (non HMO) | Admitting: Surgery

## 2022-04-23 ENCOUNTER — Encounter: Payer: Self-pay | Admitting: Surgery

## 2022-04-23 DIAGNOSIS — M6281 Muscle weakness (generalized): Secondary | ICD-10-CM | POA: Diagnosis not present

## 2022-04-23 DIAGNOSIS — M542 Cervicalgia: Secondary | ICD-10-CM | POA: Diagnosis not present

## 2022-04-23 DIAGNOSIS — R29898 Other symptoms and signs involving the musculoskeletal system: Secondary | ICD-10-CM | POA: Diagnosis not present

## 2022-04-23 DIAGNOSIS — M4722 Other spondylosis with radiculopathy, cervical region: Secondary | ICD-10-CM

## 2022-04-23 NOTE — Progress Notes (Signed)
Office Visit Note   Patient: Nicholas Roth           Date of Birth: April 21, 1961           MRN: [id number] Visit Date: 04/23/2022              Requested by: Charlott Rakes, MD [address],  [place] [id number] PCP: Charlott Rakes, MD   Assessment & Plan: Visit Diagnoses:  1. Other spondylosis with radiculopathy, cervical region   2. Muscle weakness of right upper extremity   3. Weakness of left upper extremity   4. Weakness of right lower extremity     Plan: With patient's progressively worsening symptoms since last MRI January 2023 I recommend repeating the study.  I will have patient follow-up with me in 2 weeks to discuss results and I will make decision at that time who needs to be referred to.  Patient states that he would like to speak to another physician other than Dr. Lorin Mercy.  If patient is able to have his MRI this week  Follow-Up Instructions: Return in about 2 weeks (around 05/07/2022) for [place] MRI.   Orders:  No orders of the defined types were placed in this encounter.  No orders of the defined types were placed in this encounter.     Procedures: No procedures performed   Clinical Data: No additional findings.   Subjective: Chief Complaint  Patient presents with   Neck - Pain    HPI 61 year old white male returns with complaints of worsening neck pain, bilateral upper extremity radiculopathy, right greater than left arm weakness, right lower extremity weakness and unsteady gait.  I reviewed patient's chart.  June 2022 Dr. Lorin Mercy had discussed possible two-level cervical fusion due to his symptoms.  He is continue to have ongoing issues since his last office visit with Dr. Lorin Mercy several months ago.  Feels like he is progressively getting worse.  States that his right shoulder and arm is getting weaker.  Also has weakness on the left.  He cannot walk long distances due to his right leg getting weak. Review  of Systems No current complaints of cardiopulmonary GI/GU issues  Objective: Vital Signs: There were no vitals taken for this visit.  Physical Exam HENT:     Head: Normocephalic and atraumatic.     Nose: Nose normal.  Eyes:     Extraocular Movements: Extraocular movements intact.  Musculoskeletal:     Comments: Pleasant male alert and oriented in no acute distress.  Positive bilateral brachial plexus and trapezius tenderness.  Moderate to marked right shoulder pain with impingement testing.  Negative drop arm test.  Pain and weakness with supraspinatus resistance.  He does have right supraspinatus atrophy.  Positive right greater than left biceps and triceps weakness.  Positive right quad anterior tib and gastroc weakness with resistance.  Left leg strong.  Negative clonus.  Neurological:     Mental Status: He is alert and oriented to person, place, and time.  Psychiatric:        Mood and Affect: Mood normal.     Ortho Exam  Specialty Comments:  No specialty comments available.  Imaging: No results found.   PMFS History: 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   Past Medical History:  Diagnosis Date   Foraminal  stenosis of cervical region 01/03/2021    Family History  Problem Relation Age of Onset   CAD Father     No past surgical history on file. Social History   Occupational History   Not on file  Tobacco Use   Smoking status: Every Day   Smokeless tobacco: Not on file  Substance and Sexual Activity   Alcohol use: Yes   Drug use: Yes    Types: Marijuana, Cocaine   Sexual activity: Not on file

## 2022-04-24 ENCOUNTER — Telehealth: Payer: Self-pay | Admitting: Licensed Clinical Social Worker

## 2022-04-24 LAB — CMP14+EGFR
ALT: 78 IU/L — ABNORMAL HIGH (ref 0–44)
AST: 88 IU/L — ABNORMAL HIGH (ref 0–40)
Albumin/Globulin Ratio: 1 — ABNORMAL LOW (ref 1.2–2.2)
Albumin: 4.4 g/dL (ref 3.8–4.9)
Alkaline Phosphatase: 56 IU/L (ref 44–121)
BUN/Creatinine Ratio: 14 (ref 9–20)
BUN: 19 mg/dL (ref 6–24)
Bilirubin Total: 0.5 mg/dL (ref 0.0–1.2)
CO2: 21 mmol/L (ref 20–29)
Calcium: 9.7 mg/dL (ref 8.7–10.2)
Chloride: 101 mmol/L (ref 96–106)
Creatinine, Ser: 1.33 mg/dL — ABNORMAL HIGH (ref 0.76–1.27)
Globulin, Total: 4.3 g/dL (ref 1.5–4.5)
Glucose: 85 mg/dL (ref 70–99)
Potassium: 4.2 mmol/L (ref 3.5–5.2)
Sodium: 139 mmol/L (ref 134–144)
Total Protein: 8.7 g/dL — ABNORMAL HIGH (ref 6.0–8.5)
eGFR: 62 mL/min/{1.73_m2} (ref >59)

## 2022-04-24 LAB — HCV RT-PCR, QUANT (NON-GRAPH)

## 2022-04-24 LAB — HCV AB W REFLEX TO QUANT PCR: HCV Ab: REACTIVE — AB

## 2022-04-24 LAB — GAMMA GT: GGT: 81 IU/L — ABNORMAL HIGH (ref 0–65)

## 2022-04-24 LAB — HIV ANTIBODY (ROUTINE TESTING W REFLEX): HIV Screen 4th Generation wRfx: NONREACTIVE

## 2022-04-24 NOTE — Telephone Encounter (Signed)
LCSWA met with patient and his dear friend that has been assisting him for a while. This was on Monday October the 9th during a warm hand off. Mousa friend stated tha he has been homeless for a while and she has been assisting him. He is in need for some housing resources and any other resources that may help. They stated that he is in the process of getting disability. Patient is in a lot of pain due to some health issues. Patient does receive food stamps. LCSWA was able to support the friends by giving him a flyer for the month of October for Out of the [employer] sites, housing resources , Emergency planning/management officer living agencies. Please see resources below and contact LCSWA if any other assistance is needed.  Housing Resources  [place] Housing Search: Affordable housing database. Visit the website below to search for housing in your  budget and location parameters  MeatSub.co.za  438-024-4681  [place]: Property management for several affordable housing  communities in the area. Contact individual properties for availability.  http://www.InsuranceTransaction.co.za  [place]: assists with homelessness prevention, foreclosure prevention, healthy  homes (assistance to residents who live in homes with health and safety hazards through education,  referrals, and tenant advocacy)  Mon - Fri 8:30 am - 4:30 pm  [place] [facility]): day center for people experiencing homelessness; offers resources  including showers, laundry, phones, mailroom, computer lab, medical clinic, bike maintenance, case  managers.  [place], [place] [address]  [place] - Fri 8:00 am - 3:00 pm   [place]: [employer] (PBV) waiting list specifically for (106 years of age  and older) Near-Elderly and Elderly Only is now open. Housing Choice Voucher (Section 8) waiting list   remains closed. Apply online.  [place], [place] [address]  [phone]  [email or web address]  [place]: Has Section 8 and project-based voucher waiting lists open. Complete the  application online.  [address] [place], [place] [address]  [phone]  GulfSpecialist.pl  [employer]: Has wait lists open. Call to request an application be mailed to you or go to  their office to pick up an application.  [address] [place], [place] [address]  [phone]  [email or web address]   Updated 09/2021   Advocacy/Legal Legal Aid [place]:  [phone]  /  [phone] /  LVM, taking clients  [place]:  [phone] /  Onsite, counseling with Avie Echevaria is virtual, Accepting new clients   Family Service of the [employer] 24-hr Crisis line:  [phone] Virtual & Onsite services (Client preference), Accepting New clients  [employer], [place]:  [phone] Virtual & Onsite services (Client preference), Accepting New clients  Court Watch (custody):  [phone] Virtual, Accepting new clients  [facility]:   [phone] Virtual/Telephone, accepting clients for waitlisting (time depends on services)   Baby & Breastfeeding [place] [facility] [phone] Outpatient consultant out for weeks (will be hard to get an appointment) , Support group offered Virtually (Accepting new members)  [facility] Lactation [phone] Telephone & Onsite services (Client preference), Accepting New clients  [place]: [phone] ([place]);  [phone] (HP) Virtual  [employer] [facility]:  [phone]     Childcare [place] [facility]: [phone] ([place]) / [phone] ([place])             - Child Care Resources/ Referrals/ Scholarships             -  Head Start/ Early Head Start (call or apply online) Virtually (by appointment), Accepting new families  [place] DHHS: [place] Pre-K :  [phone] / [phone]     Employment /  [place]: [phone] / [place] services (Client preference), Accepting New clients  [place] [employer] [place] ([place]): [phone] ([place]) / [phone] ([place]) Virtual & Onsite workshops, Accepting new clients  [place]: [phone] / [phone] Virtual & Onsite , Accepting New clients  [place]: [phone]   DHHS Work First: [phone] ([place]) / [phone] ([place]) Virtually, Accepting clients   [place]:  [phone]  Virtual and Onsite, Accepting new clients     [place]:  [phone] Virtual (financial assistance) & Onsite (all other services), Accepting new families  [employer]: [phone] [employer] Network (furniture):  [place] Helping Hands: [phone]   [place]: [phone] Virtual, accepting new families    [place]- SNAP/ Food Stamps: [phone] Virtual  WIC: [phone] ;  HP [phone] Virtual        During the summer, text "FOOD" to [place] / Clinics (Adults) [place] (for Adults) through [facility]: [phone]    [place]:   [place]:   [phone]   Health Department:  [place]:  [phone] / [phone]   [facility] [place]:   [phone] Onsite, Accepting new patients  [facility]:   [phone] Onsite , Accepting new patients    [place]:   [place]:  [place]:  [place] for [facility] [place]):  [phone] Onsite, Accepting new people  [facility]:   [phone] Virtual, accepting new individuals  [place]:  [place]:  [phone] Virtual, Accepting new clients  African Services Coalition:  [place].org  Virtual, Accepting new members  PFLAG  (301)319-1642 / info_0 .org  Virtual, Accepting new Members  [facility]:  [phone]  Virtual    Mental Health/ Substance Use Family Service of the [facility]  [phone] [address] [place] [address] Virtual & Onsite services (Client preference), Accepting New clients  [place]:  [phone] or [phone] [place], [place] [address] Onsite & Virtual, Accepting new clients  [facility]:  [phone] [address] [place], [place] [address] Virtual & Onsite, Accepting new clients  [facility] [place]:  [phone] Margarita Sermons [place], [place]) [address] [place], [place] [address] Onsite & Virtual, Accepting new clients  [place] (walk-ins)  [phone] / [place]:  [phone] Virtual meetings via [place]- need meeting passcode- call [phone] to receive code "AFG"= Al-Anon Family Group  [place]alanon.[email or web address]/find-meetings   Alcoholics Anonymous:  975-300-5110 LacrosseRugby.dk  Narcotics Anonymous:  211-173-5670 14 hour helpline: (571) [phone]  Quit Smoking Hotline:  800-QUIT-NOW (702)217-8122)    [facility]                                              (  [phone] [address], [place], [place] [address] Onsite & Virtual, Accepting new clients    COUNSELING AGENCIES in [place] (Accepting Medicaid)   Mental Health  (* = Spanish available;  + = Psychiatric services) * Family Service of the [place]                                [phone] [address], , [place] [address] Virtual & Onsite services (Client preference), Accepting  New clients  *+ [facility]:                                        [phone] or 1-800-711-2635 Virtual & Onsite, Accepting clients  +[facility]                                [phone]   [facility]:                                                 [phone]   + [facility]:                                           [phone] Onsite & Virtual, Accepting new clients  [facility]                               [phone] Onsite, Accepting new clients  * Family Solutions:                                                     [phone] Virtual, NOT accepting new clients  The Social Emotional Learning (SEL) Group           [phone] Virtual, accepting new clients  [facility]:                                                            [phone] Onsite & Virtual, Accepting new clients  * [facility]:                                        [phone] Onsite & Virtual, Waitlist 6-8 months for services  [facility]:                             [phone]   *[facility]                                                (  [phone] Onsite & Virtual, Accepting new clients  + [facility] [place]:             ([phone] or [place]                                                    [phone] Onsite & Virtual, Accepting new clients  *+ [facility] (walk-ins)                                                [phone] / [place]   My [place]                                              [phone] [place], Accepting new clients  Youth Unlimited (PCIT)                                              [phone] [place] (check in occasionally , subject to change)    Substance Use [place]:                                [phone]  Alcoholics Anonymous:      828-249-4133  Narcotics  Anonymous:       [phone]  Quit Smoking Hotline:         800-QUIT-NOW (531)804-8498[facility](616)281-2723 Provides information on mental health, intellectual/developmental disabilities & substance abuse services in [place]:  ([phone] Virtual , Accepting families  [facility]: 515-285-2216   [employer]: Bringing Out the Best:  [place] at Three (Hispanic families): [phone] Onsite, Accepting new children ( short wait list)  Healthy Start ([place]):  [phone] [phone]   Parents as Teachers:  [phone] Virtually, accepting families ( waitlist for Spanish speaking families )  [place] Child Psychologist, counselling- Learning Together (Immigrants): [phone]     Poison Control (669)[phone]   Sports & Recreation [facility] Open Doors Application: ImDemand.es Onsite, Accepting new families  [place] of [place]: http://www.[place].gov/index.aspx?page=3615 Onsite    Special Needs Family Support Network:  541-261-8911 Virtual, Accepting new families  Autism Society of [place]:   [place] or 343-503-7114 /  (830) [phone] Virtual, Accepting new families   [facility]:  ([phone] Virtual, Accepting families  ARC of [place]:  [phone] Virtual, Accepting new families  [place] (CDSA):  515-017-6420 Virtual, Accepting new families  [place] (Care Coordination for Children):  [phone] Virtual, accepting new patients     Transportation Medicaid Transportation: (713)055-0244 to apply  [place]: [phone] (reduced-fare bus ID to [place])  SCAT Paratransit services: Eligible riders only, call [phone] for application    Tutoring/ [place]: [phone]  No tutoring only afterschool programming (In Person), Accepting new  students  Big Brothers/ Big Sisters: [phone] ([place])  [phone] ([place])   ACES through child's school: [phone]   [employer] Achievers: contact your local Y In Person, Accepting New students  SHIELD Mentor Program: [phone] Will re-launch in the fall   Updated 10/2019    

## 2022-04-25 ENCOUNTER — Other Ambulatory Visit: Payer: Self-pay

## 2022-04-25 ENCOUNTER — Telehealth: Payer: Self-pay | Admitting: Surgery

## 2022-04-25 DIAGNOSIS — Z1159 Encounter for screening for other viral diseases: Secondary | ICD-10-CM

## 2022-04-25 NOTE — Telephone Encounter (Signed)
Pt called requesting vallum for MRI today. Please send to [facility] as soon as possible. Pt phone number is [phone].

## 2022-04-26 NOTE — Telephone Encounter (Signed)
Patient's MRI has been put on hold. [facility] website is down and we are unable to get approval.

## 2022-04-29 ENCOUNTER — Ambulatory Visit: Payer: Medicaid Other | Attending: Family Medicine

## 2022-04-29 DIAGNOSIS — Z1159 Encounter for screening for other viral diseases: Secondary | ICD-10-CM

## 2022-04-29 NOTE — Telephone Encounter (Signed)
Can you tell me if there is an update on status of MRI? Patient will need valium prior to scan. Thanks.

## 2022-04-30 NOTE — Telephone Encounter (Signed)
Noted. Patient will not need valium at this point.

## 2022-05-01 LAB — HCV RT-PCR, QUANT (GRAPH)
HCV log10: 5.017 log10 IU/mL
Hepatitis C Quantitation: 104000 IU/mL

## 2022-05-07 ENCOUNTER — Ambulatory Visit: Payer: Medicaid Other | Admitting: Surgery

## 2022-05-14 ENCOUNTER — Other Ambulatory Visit: Payer: Self-pay | Admitting: Radiology

## 2022-05-14 DIAGNOSIS — R29898 Other symptoms and signs involving the musculoskeletal system: Secondary | ICD-10-CM

## 2022-05-14 DIAGNOSIS — M6281 Muscle weakness (generalized): Secondary | ICD-10-CM

## 2022-05-14 DIAGNOSIS — M4722 Other spondylosis with radiculopathy, cervical region: Secondary | ICD-10-CM

## 2022-05-16 NOTE — Therapy (Signed)
OUTPATIENT PHYSICAL THERAPY CERVICAL EVALUATION   Patient Name: Nicholas Roth MRN: [id number] DOB:20-Oct-1960, 61 y.o., male Today's Date: 05/17/2022   PT End of Session - 05/17/22 1135     Visit Number 1    Number of Visits 17    Date for PT Re-Evaluation 07/19/22    Authorization Type UHC MCD    Authorization Time Period 27 VL    Authorization - Visit Number 1    Authorization - Number of Visits 27    PT Start Time 1057   Pt arrived 12 minutes late to his appointment.   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 No pertinent surgical history. 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    PCP: Hoy Register, MD   REFERRING PROVIDER: Naida Sleight, PA-C  REFERRING DIAG:  [phone] (ICD-10-CM) - Other spondylosis with radiculopathy, cervical region  M62.81 (ICD-10-CM) - Muscle weakness of right upper extremity  R29.898 (ICD-10-CM) - Weakness of left upper extremity    THERAPY DIAG:  Cervicalgia  Muscle weakness (generalized)  Chronic right shoulder pain  Rationale for Evaluation and Treatment: Rehabilitation  ONSET DATE: Two years ago  SUBJECTIVE:                                                                                                                                                                                                         SUBJECTIVE STATEMENT: Pt reports primary c/o Rt neck and shoulder pain that started about two years ago. 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 Pt is currently unable to work due to the pain. Pt reports intermittent Rt UE N/T and pain associated with his neck pain. These symptoms travel down the arm to the dorsum of the hand.  Pt also reports a small knot on his Lt medial wrist that causes his Lt thumb to be numb regularly. Pt rates his current neck and Rt shoulder pain as 5/10. Worst pain is 8-9/10. Best pain is 5/10. Aggravating factors include Lt cervical rotation,  reaching overhead with Rt arm. Easing factors include pain patches, pain medication. Pt adds during the session that when he turns his head, he feels like he is going to black out.   PERTINENT HISTORY:  Foraminal stenosis of cervical region  PAIN:  Are you having pain? Yes: NPRS scale: 5/10 Pain location: Rt neck and shoulder Pain description: Sharp, N/T Aggravating factors: Lt cervical rotation, reaching overhead with Rt arm Relieving factors: pain patches, pain medication  PRECAUTIONS: Avoid OMT due to concern for potential VBI  WEIGHT BEARING RESTRICTIONS: No  FALLS:  Has patient fallen in last 6 months? No  LIVING ENVIRONMENT: Lives with: lives with their family Lives in: House/apartment Stairs: No Has following equipment at home: None  OCCUPATION: Unemployed  PLOF: Independent with basic ADLs  PATIENT GOALS: Housework, yard work, working  OBJECTIVE:   DIAGNOSTIC FINDINGS:  08/06/2021: MR Cervical Spine With or Without Contrast:  IMPRESSION: Multilevel cervical spondylosis. Multilevel spinal and foraminal stenosis as above   Progressive spurring and foraminal encroachment bilaterally at C5-6 and C6-7. Moderate to severe left foraminal encroachment C7-T1 unchanged.  PATIENT SURVEYS:  NDI 33/50, 66% disability  COGNITION: Overall cognitive status: Within functional limits for tasks assessed  SENSATION: Diminished sensation on Rt dermatomes levels C6-C8  REFLEXES: Hoffman's: (-) BIL Brachioradialis: 2+ BIL Triceps: 2+ on Lt, absent on  Rt  POSTURE: rounded shoulders and forward head  PALPATION: Apparent atrophy of Rt infraspinatus, upwardly rotated and protracted Rt scapula at rest, TTP to BIL cervical and thoracic paraspinals and global parascapular musculature, global Rt shoulder  PASSIVE ACCESSORIES: Hypomobile and painful CPAs from C2-T10   CERVICAL ROM:   AROM A/PROM (deg) eval  Flexion 30p!  Extension 38p!  Right lateral flexion 20p!  Left lateral flexion 7p!  Right rotation 30p!  Left rotation 25p!   (Blank rows = not tested)  UPPER EXTREMITY ROM:  AROM Right eval Left eval  Shoulder flexion 40p!/ 170 180  Shoulder abduction 65p!/ 170 180  Shoulder ER 40p!/60p! 75  Shoulder IR 40p!/ 65p! 85   (Blank rows = not tested)  UPPER EXTREMITY MMT:  MMT Right eval Left eval  Shoulder flexion 3/5p! 5/5  Shoulder abduction 3/5p! 5/5  Shoulder internal rotation 4/5p! 5/5  Shoulder external rotation 3/5p! 5/5  Middle trapezius 2/5p! 3/5  Lower trapezius 2/5p! 3/5  Latissimus dorsi 3+/5 3+/5  Elbow flexion 4/5 5/5  Elbow extension 4/5 5/5  Wrist flexion 4/5 5/5  Wrist extension 4/5 5/5  Grip strength     (Blank rows = not tested)  CERVICAL SPECIAL TESTS:  Hawkins-Kennedy: (+) on Rt Neer's: (+) on Rt CRLF: (-) for sensory changes, although pt reports he feels like he may pass out if held in this position to Lt Spurling's: (-) VBI testing: (+) to Lt for dizziness, faint feeling Cervical compression: (+) Cervical distraction: (+)   TODAY'S TREATMENT:  [facility] Adult PT Treatment:                                                DATE: 05/17/2022 Therapeutic Exercise: Demonstrated and issued HEP with instructed on accessing HEP through MedBridge Pt education regarding probable underlying pathophysiology, POC, prognosis, and NDI Manual Therapy: N/A Neuromuscular  re-ed: N/A Therapeutic Activity: N/A Modalities: N/A Self Care: N/A   PATIENT EDUCATION:  Education details: Pt educated on probable underlying pathophysiology, POC, prognosis, NDI, and HEP Person educated: Patient Education method: Explanation, Demonstration, and Handouts Education comprehension: verbalized understanding and returned demonstration  HOME EXERCISE PROGRAM: Access Code: [email or web address] URL: [email or web address] Date: 05/17/2022 Prepared by: Carmelina Dane  Exercises - Seated Cervical Retraction  - 1 x daily - 7 x weekly - 3 sets - 10 reps - 5 seconds hold - Shoulder External Rotation and Scapular Retraction with Resistance  - 1 x daily - 7 x weekly - 3 sets - 10 reps - 3 seconds hold - Standing Shoulder Abduction with Self-Anchored Resistance  - 1 x daily - 7 x weekly - 3 sets - 10 reps - 3 seconds hold - Supine Cervical Rotation AROM on Pillow  - 1 x daily - 7 x weekly - 3 sets - 10 reps  ASSESSMENT:  CLINICAL IMPRESSION: Patient is a 61 y.o. M who was seen today for physical therapy evaluation and treatment for chronic Rt neck and shoulder pain with radicular symptoms.  Upon assessment, the pt's primary impairments include painful and limited global cervical AROM, painful and weak global Rt shoulder MMT, weak BIL parascapular MMT with pain on Rt, painful and hypomobile cervicothoracic passive accessory mobility, impaired Rt upper quarter dermatomal testing, absent Rt triceps reflex, and TTP to BIL cervicothoracic paraspinals and parascapular musculature. Ruling up Rt sided cervical radiculopathy, as well as probable RTC tear. Additionally, the pt exhibits apparent significant Rt infraspinatus atrophy as well as an upwardly rotated and protracted Rt scapula at rest. This may be indicative of advanced cervical radiculopathy. Of note, during CRLF and VBI testing, the pt reports feeling dizzy and faint. He reports he often comes close to blacking out when  turning his head. Pt was instructed to follow up with his PCM regarding this problem to rule in/ out vertebrobasilar insufficiency. Also cannot rule out Lt wrist ganglion cyst due to palpable cyst-like structure about 0.5cm in diameter about the scaphoid with associated Lt thumb numbness. Pt will benefit from skilled PT to address his primary impairments and return to his prior level of function with less limitation.  OBJECTIVE IMPAIRMENTS: decreased activity tolerance, decreased endurance, decreased knowledge of condition, decreased ROM, decreased strength, dizziness, hypomobility, impaired flexibility, impaired sensation, impaired UE functional use, improper body mechanics, postural dysfunction, and pain.   ACTIVITY LIMITATIONS: carrying, lifting, bending, sitting, standing, squatting, sleeping, stairs, transfers, bed mobility, dressing, reach over head, hygiene/grooming, and locomotion level  PARTICIPATION LIMITATIONS: meal prep, cleaning, laundry, driving, shopping, community activity, occupation, and yard work  PERSONAL FACTORS: Time since onset of injury/illness/exacerbation and 1 comorbidity: See medical hx  are also affecting patient's functional outcome.   REHAB POTENTIAL: Fair Due to time since onset, as well as multiple body parts/ pain presentations  CLINICAL DECISION MAKING: Evolving/moderate complexity  EVALUATION COMPLEXITY: Moderate   GOALS: Goals reviewed with patient? Yes  SHORT TERM GOALS: Target date: 06/14/2022   Pt will  report understanding and adherence to initial HEP in order to promote independence in the management of primary impairments. Baseline: HEP provided at eval Goal status: INITIAL    LONG TERM GOALS: Target date: 07/12/2022  Pt will achieve an NDI score of 50% or less in order to demonstrate improved functional ability as it relates to the pt's primary impairments. Baseline: 66% Goal status: INITIAL  2.  Pt will achieve Rt shoulder elevation AROM of  120 degrees in order to reach into overhead cabinets with less limitation. Baseline: See AROM chart Goal status: INITIAL  3.  Pt will achieve global cervical AROM WFL with 0-4/10 pain in order to get dressed with less limitation. Baseline: See AROM chart Goal status: INITIAL  4.  Pt will achieve Rt shoulder global MMT of 4/5 or greater in order to return to working with less limitation. Baseline: See MMT chart Goal status: INITIAL  5.  Pt will report worst pain as 5/10 in order to perform yard work with less limitation. Baseline: 9/10 worst pain Goal status: INITIAL    PLAN:  PT FREQUENCY: 2x/week  PT DURATION: 8 weeks  PLANNED INTERVENTIONS: Therapeutic exercises, Therapeutic activity, Neuromuscular re-education, Balance training, Gait training, Patient/Family education, Self Care, Joint mobilization, Vestibular training, DME instructions, Aquatic Therapy, Dry Needling, Electrical stimulation, Spinal mobilization, Cryotherapy, Moist heat, Taping, Traction, Ultrasound, Ionotophoresis 4mg /ml Dexamethasone, Manual therapy, and Re-evaluation  PLAN FOR NEXT SESSION: Progress early DNF, parascapular, and RTC strengthening, mobility exercises and manual therapy for cervical AROM, avoid OMT until VBI cleared*   Check all possible CPT codes: 97164 - PT Re-evaluation, 97110- Therapeutic Exercise, [id number]- Neuro Re-education, [phone] - Gait Training, 97140 - Manual Therapy, 97530 - Therapeutic Activities, 97535 - Self Care, [phone] - Mechanical traction, 97014 - Electrical stimulation (unattended), [id number] - Electrical stimulation (Manual), [id number] - Iontophoresis, and [id number] - Canalith Repositioning    Check all conditions that are expected to impact treatment: Musculoskeletal disorders   If treatment provided at initial evaluation, no treatment charged due to lack of authorization.       Vanessa Hoke, PT, DPT 05/17/22 11:58 AM

## 2022-05-17 ENCOUNTER — Other Ambulatory Visit: Payer: Self-pay

## 2022-05-17 ENCOUNTER — Ambulatory Visit: Payer: Commercial Managed Care - HMO | Attending: Surgery

## 2022-05-17 DIAGNOSIS — M25561 Pain in right knee: Secondary | ICD-10-CM | POA: Diagnosis present

## 2022-05-17 DIAGNOSIS — M542 Cervicalgia: Secondary | ICD-10-CM | POA: Insufficient documentation

## 2022-05-17 DIAGNOSIS — M25511 Pain in right shoulder: Secondary | ICD-10-CM | POA: Insufficient documentation

## 2022-05-17 DIAGNOSIS — M6281 Muscle weakness (generalized): Secondary | ICD-10-CM | POA: Insufficient documentation

## 2022-05-17 DIAGNOSIS — M4722 Other spondylosis with radiculopathy, cervical region: Secondary | ICD-10-CM | POA: Insufficient documentation

## 2022-05-17 DIAGNOSIS — G8929 Other chronic pain: Secondary | ICD-10-CM | POA: Diagnosis present

## 2022-05-17 DIAGNOSIS — R29898 Other symptoms and signs involving the musculoskeletal system: Secondary | ICD-10-CM | POA: Diagnosis not present

## 2022-05-22 ENCOUNTER — Telehealth: Payer: Self-pay | Admitting: Physical Therapy

## 2022-05-22 ENCOUNTER — Ambulatory Visit: Payer: Commercial Managed Care - HMO | Admitting: Physical Therapy

## 2022-05-22 NOTE — Therapy (Deleted)
OUTPATIENT PHYSICAL THERAPY TREATMENT NOTE   Patient Name: TRAVAUGHN VUE MRN: [id number] DOB:1961/02/08, 61 y.o., male Today's Date: 05/22/2022  PCP: Hoy Register, MD     REFERRING PROVIDER: Delbert Phenix    Past Medical History:  Diagnosis Date   Foraminal stenosis of cervical region 01/03/2021   No past surgical history on file. 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    THERAPY DIAG:  No diagnosis found.   Rationale for Evaluation and Treatment Rehabilitation  REFERRING DIAG:  M47.22 (ICD-10-CM) - Other spondylosis with radiculopathy, cervical region  M62.81 (ICD-10-CM) - Muscle weakness of right upper extremity  R29.898 (ICD-10-CM) - Weakness of left upper extremity    PERTINENT HISTORY: Foraminal stenosis of cervical region   PRECAUTIONS/RESTRICTIONS:   Avoid OMT due to concern for potential VBI   SUBJECTIVE:  ***  PAIN:  Are you having pain? Yes: NPRS scale: 5/10 Pain location: Rt neck and shoulder Pain description: Sharp, N/T Aggravating factors: Lt cervical rotation, reaching overhead with Rt arm Relieving factors: pain patches, pain medication  OBJECTIVE: (objective measures completed at initial evaluation unless otherwise dated)  DIAGNOSTIC FINDINGS:  08/06/2021: MR Cervical Spine With or Without Contrast:  IMPRESSION: Multilevel cervical spondylosis. Multilevel spinal and foraminal stenosis as above   Progressive spurring and foraminal encroachment bilaterally at C5-6 and C6-7. Moderate to severe left foraminal encroachment C7-T1 unchanged.   PATIENT SURVEYS:  NDI 33/50, 66% disability   COGNITION: Overall cognitive status: Within functional limits for tasks assessed   SENSATION: Diminished sensation on Rt dermatomes levels C6-C8   REFLEXES: Hoffman's: (-)  BIL Brachioradialis: 2+ BIL Triceps: 2+ on Lt, absent on Rt   POSTURE: rounded shoulders and forward head   PALPATION: Apparent atrophy of Rt infraspinatus, upwardly rotated and protracted Rt scapula at rest, TTP to BIL cervical and thoracic paraspinals and global parascapular musculature, global Rt shoulder   PASSIVE ACCESSORIES: Hypomobile and painful CPAs from C2-T10          CERVICAL ROM:    AROM A/PROM (deg) eval  Flexion 30p!  Extension 38p!  Right lateral flexion 20p!  Left lateral flexion 7p!  Right rotation 30p!  Left rotation 25p!   (Blank rows = not tested)   UPPER EXTREMITY ROM:   AROM Right eval Left eval  Shoulder flexion 40p!/ 170 180  Shoulder abduction 65p!/ 170 180  Shoulder ER 40p!/60p! 75  Shoulder IR 40p!/ 65p! 85   (Blank rows = not tested)   UPPER EXTREMITY MMT:   MMT Right eval Left eval  Shoulder flexion 3/5p! 5/5  Shoulder abduction 3/5p! 5/5  Shoulder internal rotation 4/5p! 5/5  Shoulder external rotation 3/5p! 5/5  Middle trapezius 2/5p! 3/5  Lower trapezius 2/5p! 3/5  Latissimus dorsi 3+/5 3+/5  Elbow flexion 4/5 5/5  Elbow extension 4/5 5/5  Wrist flexion 4/5 5/5  Wrist extension 4/5 5/5  Grip strength       (Blank rows = not tested)   CERVICAL SPECIAL TESTS:  Hawkins-Kennedy: (+) on Rt Neer's: (+) on Rt CRLF: (-) for sensory changes, although pt reports he feels like he may pass out if held in this position to Lt Spurling's: (-) VBI testing: (+) to Lt for dizziness, faint feeling Cervical compression: (+) Cervical distraction: (+)    HOME EXERCISE PROGRAM: Access Code: [email or web address] URL: [email or web address] Date: 05/17/2022 Prepared by: Pricilla Holm  [place]   Exercises - Seated Cervical Retraction  - 1 x daily - 7 x weekly - 3 sets - 10 reps - 5 seconds hold - Shoulder External Rotation and Scapular Retraction with Resistance  - 1 x daily - 7 x weekly - 3 sets - 10 reps - 3 seconds hold - Standing  Shoulder Abduction with Self-Anchored Resistance  - 1 x daily - 7 x weekly - 3 sets - 10 reps - 3 seconds hold - Supine Cervical Rotation AROM on Pillow  - 1 x daily - 7 x weekly - 3 sets - 10 reps  TREATMENT ***:  Therapeutic Exercise: - ***  Manual Therapy: - ***  Neuromuscular re-ed: - ***  Therapeutic Activity: - ***  Self-care/Home Management: - ***  ASSESSMENT:   CLINICAL IMPRESSION: Patient is a 61 y.o. M who was seen today for physical therapy evaluation and treatment for chronic Rt neck and shoulder pain with radicular symptoms.  Upon assessment, the pt's primary impairments include painful and limited global cervical AROM, painful and weak global Rt shoulder MMT, weak BIL parascapular MMT with pain on Rt, painful and hypomobile cervicothoracic passive accessory mobility, impaired Rt upper quarter dermatomal testing, absent Rt triceps reflex, and TTP to BIL cervicothoracic paraspinals and parascapular musculature. Ruling up Rt sided cervical radiculopathy, as well as probable RTC tear. Additionally, the pt exhibits apparent significant Rt infraspinatus atrophy as well as an upwardly rotated and protracted Rt scapula at rest. This may be indicative of advanced cervical radiculopathy. Of note, during CRLF and VBI testing, the pt reports feeling dizzy and faint. He reports he often comes close to blacking out when turning his head. Pt was instructed to follow up with his PCM regarding this problem to rule in/ out vertebrobasilar insufficiency. Also cannot rule out Lt wrist ganglion cyst due to palpable cyst-like structure about 0.5cm in diameter about the scaphoid with associated Lt thumb numbness. Pt will benefit from skilled PT to address his primary impairments and return to his prior level of function with less limitation.   OBJECTIVE IMPAIRMENTS: decreased activity tolerance, decreased endurance, decreased knowledge of condition, decreased ROM, decreased strength, dizziness,  hypomobility, impaired flexibility, impaired sensation, impaired UE functional use, improper body mechanics, postural dysfunction, and pain.    ACTIVITY LIMITATIONS: carrying, lifting, bending, sitting, standing, squatting, sleeping, stairs, transfers, bed mobility, dressing, reach over head, hygiene/grooming, and locomotion level   PARTICIPATION LIMITATIONS: meal prep, cleaning, laundry, driving, shopping, community activity, occupation, and yard work   PERSONAL FACTORS: Time since onset of injury/illness/exacerbation and 1 comorbidity: See medical hx  are also affecting patient's functional outcome.    REHAB POTENTIAL: Fair Due to time since onset, as well as multiple body parts/ pain presentations   CLINICAL DECISION MAKING: Evolving/moderate complexity   EVALUATION COMPLEXITY: Moderate     GOALS: Goals reviewed with patient? Yes   SHORT TERM GOALS: Target date: 06/14/2022    Pt will report understanding and adherence to initial HEP in order to promote independence in the management of primary impairments. Baseline: HEP provided at eval Goal status: INITIAL       LONG TERM GOALS: Target date: 07/12/2022   Pt will achieve an NDI score of 50% or less in order to demonstrate improved functional ability as it relates to the pt's primary impairments. Baseline: 66% Goal status: INITIAL   2.  Pt will achieve Rt shoulder elevation AROM of 120 degrees in order to reach into overhead cabinets with less limitation. Baseline: See AROM  chart Goal status: INITIAL   3.  Pt will achieve global cervical AROM WFL with 0-4/10 pain in order to get dressed with less limitation. Baseline: See AROM chart Goal status: INITIAL   4.  Pt will achieve Rt shoulder global MMT of 4/5 or greater in order to return to working with less limitation. Baseline: See MMT chart Goal status: INITIAL   5.  Pt will report worst pain as 5/10 in order to perform yard work with less limitation. Baseline: 9/10 worst  pain Goal status: INITIAL       PLAN:   PT FREQUENCY: 2x/week   PT DURATION: 8 weeks   PLANNED INTERVENTIONS: Therapeutic exercises, Therapeutic activity, Neuromuscular re-education, Balance training, Gait training, Patient/Family education, Self Care, Joint mobilization, Vestibular training, DME instructions, Aquatic Therapy, Dry Needling, Electrical stimulation, Spinal mobilization, Cryotherapy, Moist heat, Taping, Traction, Ultrasound, Ionotophoresis 4mg /ml Dexamethasone, Manual therapy, and Re-evaluation   PLAN FOR NEXT SESSION: Progress early DNF, parascapular, and RTC strengthening, mobility exercises and manual therapy for cervical AROM, avoid OMT until VBI clearedKevan Ny Thorsten Climer PT 05/22/2022, 12:05 PM

## 2022-05-22 NOTE — Telephone Encounter (Signed)
Called and informed patient of missed visit and provided reminder of next appt and attendance policy.  

## 2022-05-24 ENCOUNTER — Telehealth: Payer: Self-pay | Admitting: Physical Therapy

## 2022-05-24 ENCOUNTER — Ambulatory Visit: Payer: Commercial Managed Care - HMO | Admitting: Physical Therapy

## 2022-05-24 NOTE — Telephone Encounter (Addendum)
Called and informed patient of missed visit and attendance policy (voicemail).  Will cancel remaining appts.  If pt calls back, he can schedule 1 visit at a time.

## 2022-05-28 ENCOUNTER — Ambulatory Visit: Payer: Commercial Managed Care - HMO

## 2022-05-30 ENCOUNTER — Ambulatory Visit: Payer: Commercial Managed Care - HMO

## 2022-05-30 DIAGNOSIS — M542 Cervicalgia: Secondary | ICD-10-CM | POA: Diagnosis not present

## 2022-05-30 DIAGNOSIS — M6281 Muscle weakness (generalized): Secondary | ICD-10-CM

## 2022-05-30 DIAGNOSIS — G8929 Other chronic pain: Secondary | ICD-10-CM

## 2022-05-30 NOTE — Therapy (Addendum)
OUTPATIENT PHYSICAL THERAPY TREATMENT NOTE/ DISCHARGE SUMMARY   Patient Name: Nicholas Roth MRN: [id number] DOB:1960/07/17, 61 y.o., male Today's Date: 05/30/2022  PCP: Hoy Register, MD  REFERRING PROVIDER: Naida Sleight, PA-C   END OF SESSION:   PT End of Session - 05/30/22 1533     Visit Number 2    Number of Visits 17    Date for PT Re-Evaluation 07/19/22    Authorization Type [employer] MCD    Authorization Time Period 27 VL    Authorization - Visit Number 2    Authorization - Number of Visits 27    PT Start Time 1535    PT Stop Time 1613    PT Time Calculation (min) 38 min    Activity Tolerance Patient limited by pain    Behavior During Therapy [facility] for tasks assessed/performed             Past Medical History:  Diagnosis Date   Foraminal stenosis of cervical region 01/03/2021   History reviewed. No pertinent surgical history. 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    REFERRING DIAG:  M47.22 (ICD-10-CM) - Other spondylosis with radiculopathy, cervical region  M62.81 (ICD-10-CM) - Muscle weakness of right upper extremity  R29.898 (ICD-10-CM) - Weakness of left upper extremity    THERAPY DIAG:  Cervicalgia  Muscle weakness (generalized)  Chronic right shoulder pain  Chronic pain of right knee  Rationale for Evaluation and Treatment Rehabilitation  PERTINENT HISTORY: Foraminal stenosis of cervical region   PRECAUTIONS: Avoid OMT due to concern for potential VBI   SUBJECTIVE:                                                                                                                                                                                      SUBJECTIVE STATEMENT:  Pt reports his HEP has been going well, adding that he can lift his arm a little higher than before. Pt denies any  pain currently.    PAIN:  Are you having pain? Yes: NPRS scale: 0/10 Pain location: Rt neck and shoulder Pain description: Sharp, N/T Aggravating factors: Lt cervical rotation, reaching overhead with Rt arm Relieving factors: pain patches, pain medication   OBJECTIVE: (objective measures completed at initial evaluation unless otherwise dated)   DIAGNOSTIC FINDINGS:  08/06/2021: MR Cervical Spine With or Without Contrast:  IMPRESSION: Multilevel cervical spondylosis. Multilevel spinal and foraminal stenosis as above   Progressive spurring and foraminal encroachment bilaterally at C5-6 and C6-7. Moderate to severe left foraminal encroachment C7-T1  unchanged.   PATIENT SURVEYS:  NDI 33/50, 66% disability   COGNITION: Overall cognitive status: Within functional limits for tasks assessed   SENSATION: Diminished sensation on Rt dermatomes levels C6-C8   REFLEXES: Hoffman's: (-) BIL Brachioradialis: 2+ BIL Triceps: 2+ on Lt, absent on Rt   POSTURE: rounded shoulders and forward head   PALPATION: Apparent atrophy of Rt infraspinatus, upwardly rotated and protracted Rt scapula at rest, TTP to BIL cervical and thoracic paraspinals and global parascapular musculature, global Rt shoulder   PASSIVE ACCESSORIES: Hypomobile and painful CPAs from C2-T10          CERVICAL ROM:    AROM A/PROM (deg) eval  Flexion 30p!  Extension 38p!  Right lateral flexion 20p!  Left lateral flexion 7p!  Right rotation 30p!  Left rotation 25p!   (Blank rows = not tested)   UPPER EXTREMITY ROM:   AROM Right eval Left eval  Shoulder flexion 40p!/ 170 180  Shoulder abduction 65p!/ 170 180  Shoulder ER 40p!/60p! 75  Shoulder IR 40p!/ 65p! 85   (Blank rows = not tested)   UPPER EXTREMITY MMT:   MMT Right eval Left eval  Shoulder flexion 3/5p! 5/5  Shoulder abduction 3/5p! 5/5  Shoulder internal rotation 4/5p! 5/5  Shoulder external rotation 3/5p! 5/5  Middle trapezius 2/5p! 3/5   Lower trapezius 2/5p! 3/5  Latissimus dorsi 3+/5 3+/5  Elbow flexion 4/5 5/5  Elbow extension 4/5 5/5  Wrist flexion 4/5 5/5  Wrist extension 4/5 5/5  Grip strength       (Blank rows = not tested)   CERVICAL SPECIAL TESTS:  Hawkins-Kennedy: (+) on Rt Neer's: (+) on Rt CRLF: (-) for sensory changes, although pt reports he feels like he may pass out if held in this position to Lt Spurling's: (-) VBI testing: (+) to Lt for dizziness, faint feeling Cervical compression: (+) Cervical distraction: (+)     TODAY'S TREATMENT:                                                         [facility] Adult PT Treatment:                                                DATE: 05/30/2022 Therapeutic Exercise: Seated low rows with 30# cable 2x10 Seated high rows with 30# cable 2x10 Seated lat pull-downs with 20# cable 2x10 Seated shoulder rolls 2x10 forward and backward Standing BIL shoulder extension with 3# cables 3x10 Standing arm circles 2x10 forward and backward Standing modified Lt UE Median nerve glide 2x10 Standing BIL shoulder ER with 3# cables 2x10 Standing corner pec stretch 2x9min Standing cross-body shoulder adduction stretch x22min BIL Manual Therapy: N/A Neuromuscular re-ed: N/A Therapeutic Activity: N/A Modalities: N/A Self Care: N/A                                                                       [facility] Adult PT Treatment:  DATE: 05/17/2022 Therapeutic Exercise: Demonstrated and issued HEP with instructed on accessing HEP through MedBridge Pt education regarding probable underlying pathophysiology, POC, prognosis, and NDI Manual Therapy: N/A Neuromuscular re-ed: N/A Therapeutic Activity: N/A Modalities: N/A Self Care: N/A     PATIENT EDUCATION:  Education details: Pt educated on probable underlying pathophysiology, POC, prognosis, NDI, and HEP Person educated: Patient Education method: Explanation, Demonstration, and  Handouts Education comprehension: verbalized understanding and returned demonstration   HOME EXERCISE PROGRAM: Access Code: [email or web address] URL: [email or web address] Date: 05/17/2022 Prepared by: Carmelina Daneucker [place]   Exercises - Seated Cervical Retraction  - 1 x daily - 7 x weekly - 3 sets - 10 reps - 5 seconds hold - Shoulder External Rotation and Scapular Retraction with Resistance  - 1 x daily - 7 x weekly - 3 sets - 10 reps - 3 seconds hold - Standing Shoulder Abduction with Self-Anchored Resistance  - 1 x daily - 7 x weekly - 3 sets - 10 reps - 3 seconds hold - Supine Cervical Rotation AROM on Pillow  - 1 x daily - 7 x weekly - 3 sets - 10 reps   ASSESSMENT:   CLINICAL IMPRESSION: Pt responded excellently to all interventions today, demonstrating good form and no increase in pain with completed exercises. He will continue to benefit from skilled PT to address his primary impairments and return to his prior level of function with less limitation.    OBJECTIVE IMPAIRMENTS: decreased activity tolerance, decreased endurance, decreased knowledge of condition, decreased ROM, decreased strength, dizziness, hypomobility, impaired flexibility, impaired sensation, impaired UE functional use, improper body mechanics, postural dysfunction, and pain.    ACTIVITY LIMITATIONS: carrying, lifting, bending, sitting, standing, squatting, sleeping, stairs, transfers, bed mobility, dressing, reach over head, hygiene/grooming, and locomotion level   PARTICIPATION LIMITATIONS: meal prep, cleaning, laundry, driving, shopping, community activity, occupation, and yard work   PERSONAL FACTORS: Time since onset of injury/illness/exacerbation and 1 comorbidity: See medical hx  are also affecting patient's functional outcome.       GOALS: Goals reviewed with patient? Yes   SHORT TERM GOALS: Target date: 06/14/2022    Pt will report understanding and adherence to initial HEP in order to promote  independence in the management of primary impairments. Baseline: HEP provided at eval Goal status: INITIAL       LONG TERM GOALS: Target date: 07/12/2022   Pt will achieve an NDI score of 50% or less in order to demonstrate improved functional ability as it relates to the pt's primary impairments. Baseline: 66% Goal status: INITIAL   2.  Pt will achieve Rt shoulder elevation AROM of 120 degrees in order to reach into overhead cabinets with less limitation. Baseline: See AROM chart Goal status: INITIAL   3.  Pt will achieve global cervical AROM WFL with 0-4/10 pain in order to get dressed with less limitation. Baseline: See AROM chart Goal status: INITIAL   4.  Pt will achieve Rt shoulder global MMT of 4/5 or greater in order to return to working with less limitation. Baseline: See MMT chart Goal status: INITIAL   5.  Pt will report worst pain as 5/10 in order to perform yard work with less limitation. Baseline: 9/10 worst pain Goal status: INITIAL       PLAN:   PT FREQUENCY: 2x/week   PT DURATION: 8 weeks   PLANNED INTERVENTIONS: Therapeutic exercises, Therapeutic activity, Neuromuscular re-education, Balance training, Gait training, Patient/Family education, Self Care, Joint mobilization, Vestibular training, DME instructions, Aquatic Therapy, Dry Needling,  Electrical stimulation, Spinal mobilization, Cryotherapy, Moist heat, Taping, Traction, Ultrasound, Ionotophoresis 4mg /ml Dexamethasone, Manual therapy, and Re-evaluation   PLAN FOR NEXT SESSION: Progress early DNF, parascapular, and RTC strengthening, mobility exercises and manual therapy for cervical AROM, avoid OMT until VBI cleared*   , PT 05/30/2022, 4:13 PM   PHYSICAL THERAPY DISCHARGE SUMMARY  Visits from Start of Care: 2  Current functional level related to goals / functional outcomes: Unable to assess   Remaining deficits: Unable to assess   Education / Equipment: HEP    Patient agrees to discharge. Patient goals were not met. Patient is being discharged due to not returning since the last visit.  06/01/2022, PT, DPT 08/01/22 1:53 PM

## 2022-06-05 ENCOUNTER — Encounter: Payer: Medicaid Other | Admitting: Physical Therapy

## 2022-06-05 ENCOUNTER — Ambulatory Visit: Payer: Commercial Managed Care - HMO | Admitting: Physical Therapy

## 2022-06-18 ENCOUNTER — Ambulatory Visit: Payer: Medicaid Other | Admitting: Physical Therapy

## 2022-06-19 ENCOUNTER — Encounter: Payer: Managed Care, Other (non HMO) | Admitting: Physical Therapy

## 2022-06-26 ENCOUNTER — Ambulatory Visit: Payer: Medicaid Other | Admitting: Orthopedic Surgery

## 2022-07-23 ENCOUNTER — Encounter: Payer: Self-pay | Admitting: Family Medicine

## 2022-07-23 ENCOUNTER — Ambulatory Visit: Payer: BLUE CROSS/BLUE SHIELD | Attending: Family Medicine | Admitting: Family Medicine

## 2022-07-23 ENCOUNTER — Other Ambulatory Visit: Payer: Self-pay

## 2022-07-23 VITALS — BP 145/101 | HR 77 | Temp 98.3°F | Resp 16 | Ht 68.0 in | Wt 170.0 lb

## 2022-07-23 DIAGNOSIS — N5089 Other specified disorders of the male genital organs: Secondary | ICD-10-CM | POA: Diagnosis not present

## 2022-07-23 DIAGNOSIS — M5136 Other intervertebral disc degeneration, lumbar region: Secondary | ICD-10-CM | POA: Diagnosis not present

## 2022-07-23 DIAGNOSIS — Z1211 Encounter for screening for malignant neoplasm of colon: Secondary | ICD-10-CM

## 2022-07-23 DIAGNOSIS — M5412 Radiculopathy, cervical region: Secondary | ICD-10-CM

## 2022-07-23 DIAGNOSIS — R310 Gross hematuria: Secondary | ICD-10-CM | POA: Diagnosis not present

## 2022-07-23 DIAGNOSIS — R030 Elevated blood-pressure reading, without diagnosis of hypertension: Secondary | ICD-10-CM

## 2022-07-23 LAB — POCT URINALYSIS DIP (CLINITEK)
Bilirubin, UA: NEGATIVE
Blood, UA: NEGATIVE
Glucose, UA: NEGATIVE mg/dL
Ketones, POC UA: NEGATIVE mg/dL
Nitrite, UA: POSITIVE — AB
POC PROTEIN,UA: NEGATIVE
Spec Grav, UA: 1.015 (ref 1.010–1.025)
Urobilinogen, UA: 0.2 E.U./dL
pH, UA: 6 (ref 5.0–8.0)

## 2022-07-23 MED ORDER — LORAZEPAM 0.5 MG PO TABS
0.5000 mg | ORAL_TABLET | Freq: Once | ORAL | 0 refills | Status: AC
  Filled 2022-07-23: qty 1, 1d supply, fill #0

## 2022-07-23 NOTE — Patient Instructions (Signed)

## 2022-07-23 NOTE — Progress Notes (Signed)
Subjective:  Patient ID: Nicholas Roth, male    DOB: [birth year]  Age: 62 y.o. MRN: [id number]  CC: Back Pain   HPI Nicholas Roth is a 62 y.o. year old male with a history of cervical radiculopathy, prediabetes (A1c 6.0) here for a follow up visit   Interval History:  He continues to experience back pain which extends from his cervical region to lumbar region.  I had referred him to orthopedics and he had a visit in 04/2022 and notes reviewed which indicate the patient needed to have followed up in 2 weeks after his MRI. He was also referred for PT. He never underwent his MRI as his sister Complains of him being claustrophobic. He also has lumbar spine pain and this he states has been chronic.  Lumbar spine x-ray from 2008 revealed degenerative changes at L5-S1.  Pain does not radiate down his legs and he has had no recent falls.  He jumped over the gate at his Sister's house and injured his scrotum with associated swelling 2 months ago but he never sought medical attention.  He did have some hematuria but no purulent discharge from his penis. 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  Family History  Problem Relation Age of Onset   CAD Father     Social History   Socioeconomic History   Marital status: Single    Spouse name: Not on file   Number of children: Not on file   Years of education: Not on file   Highest education level: Not on file  Occupational History   Not on file  Tobacco Use   Smoking status: Every Day   Smokeless tobacco: Not on file  Substance and Sexual Activity   Alcohol use: Yes   Drug use: Yes    Types: Marijuana, Cocaine   Sexual activity: Not on file  Other Topics Concern   Not on file  Social History Narrative   Not on file   Social Determinants of Health   Financial Resource Strain: Not on file  Food Insecurity: Not on file  Transportation  Needs: Not on file  Physical Activity: Not on file  Stress: Not on file  Social Connections: Not on file    No Known Allergies  Outpatient Medications Prior to Visit  Medication Sig Dispense Refill   acetaminophen (TYLENOL) 500 MG tablet Take 500 mg by mouth every 6 (six) hours as needed for moderate pain.     ALPRAZolam (XANAX) 1 MG tablet Take one tablet 30 min prior to MRI 1 tablet 0   diazepam (VALIUM) 5 MG tablet Take 2 tablets 1 hr before MRI scan. Take extra tablet with you 3 tablet 0   DULoxetine (CYMBALTA) 60 MG capsule Take 1 capsule (60 mg total) by mouth daily. 30 capsule 3   gabapentin (NEURONTIN) 300 MG capsule TAKE 1 CAPSULE (300 MG TOTAL) BY MOUTH 2 (TWO) TIMES DAILY. 60 capsule 3   lidocaine (LIDODERM) 5 % Place 1 patch onto the skin daily as needed. Apply patch to area most significant pain once per day.  Remove and discard patch within 12 hours of application. 30 patch 3   metaxalone (SKELAXIN) 800 MG tablet Take 1 tablet (800 mg total) by mouth 3 (three) times daily. 90 tablet 3   nicotine (NICODERM CQ) 14 mg/24hr patch Place 1 patch (14 mg total) onto the skin daily. 28 patch  2   Vitamin D, Ergocalciferol, (DRISDOL) 1.25 MG (50000 UNIT) CAPS capsule Take 1 capsule (50,000 Units total) by mouth every 7 (seven) days. 16 capsule 0   No facility-administered medications prior to visit.     ROS Review of Systems  Constitutional:  Negative for activity change and appetite change.  HENT:  Negative for sinus pressure and sore throat.   Respiratory:  Negative for chest tightness, shortness of breath and wheezing.   Cardiovascular:  Negative for chest pain and palpitations.  Gastrointestinal:  Negative for abdominal distention, abdominal pain and constipation.  Genitourinary: Negative.   Musculoskeletal:        See HPI  Neurological:  Positive for weakness.  Psychiatric/Behavioral:  Negative for behavioral problems and dysphoric mood.     Objective:  BP (!) 145/101    Pulse 77   Temp 98.3 F (36.8 C) (Oral)   Resp 16   Ht 5\' 8"  (1.727 m)   Wt 170 lb (77.1 kg)   SpO2 97%   BMI 25.85 kg/m      07/23/2022    3:48 PM 07/23/2022    3:06 PM 07/23/2022    3:03 PM  BP/Weight  Systolic BP 287 681   Diastolic BP 157 98   Wt. (Lbs)   170  BMI   25.85 kg/m2      Physical Exam Constitutional:      Appearance: He is well-developed.  Neck:     Comments: Restricted range of motion of cervical spine Cardiovascular:     Rate and Rhythm: Normal rate.     Heart sounds: Normal heart sounds. No murmur heard. Pulmonary:     Effort: Pulmonary effort is normal.     Breath sounds: Normal breath sounds. No wheezing or rales.  Chest:     Chest wall: No tenderness.  Abdominal:     General: Bowel sounds are normal. There is no distension.     Palpations: Abdomen is soft. There is no mass.     Tenderness: There is no abdominal tenderness.  Genitourinary:    Comments: Right testicle slightly larger than left testicle No tenderness on palpation Musculoskeletal:     Cervical back: Tenderness present.     Right lower leg: No edema.     Left lower leg: No edema.     Comments: Slight tenderness on palpation of lumbar spine Negative straight leg raise bilaterally Normal range of motion of bilateral upper extremities  Neurological:     Mental Status: He is alert and oriented to person, place, and time.     Comments: Reduced handgrip bilaterally right greater than left  Psychiatric:        Mood and Affect: Mood normal.        Latest Ref Rng & Units 04/22/2022    3:30 PM 10/18/2021    2:48 PM 01/11/2021   10:02 AM  CMP  Glucose 70 - 99 mg/dL 85  96  88   BUN 6 - 24 mg/dL 19  13  12    Creatinine 0.76 - 1.27 mg/dL 1.33  1.00  0.95   Sodium 134 - 144 mmol/L 139  139  143   Potassium 3.5 - 5.2 mmol/L 4.2  3.8  4.1   Chloride 96 - 106 mmol/L 101  97  103   CO2 20 - 29 mmol/L 21  23  23    Calcium 8.7 - 10.2 mg/dL 9.7  9.3  8.8   Total Protein 6.0 - 8.5 g/dL 8.7  8.7  7.4   Total Bilirubin 0.0 - 1.2 mg/dL 0.5  0.8  0.3   Alkaline Phos 44 - 121 IU/L 56  60  47   AST 0 - 40 IU/L 88  134  61   ALT 0 - 44 IU/L 78  123  61     Lipid Panel  No results found for: "CHOL", "TRIG", "HDL", "CHOLHDL", "VLDL", "LDLCALC", "LDLDIRECT"  CBC    Component Value Date/Time   WBC 5.4 01/11/2021 1002   WBC 6.2 01/23/2020 0939   RBC 4.29 01/11/2021 1002   RBC 4.50 01/23/2020 0939   HGB 13.8 01/11/2021 1002   HCT 40.4 01/11/2021 1002   PLT 225 01/11/2021 1002   MCV 94 01/11/2021 1002   MCH 32.2 01/11/2021 1002   MCH 31.6 01/23/2020 0939   MCHC 34.2 01/11/2021 1002   MCHC 32.6 01/23/2020 0939   RDW 13.7 01/11/2021 1002   LYMPHSABS 2.9 01/11/2021 1002   MONOABS 1.1 (H) 01/23/2020 0939   EOSABS 0.1 01/11/2021 1002   BASOSABS 0.0 01/11/2021 1002    Lab Results  Component Value Date   HGBA1C 6.1 (H) 10/18/2021    Assessment & Plan:  1. Degenerative lumbar disc Uncontrolled His last x-ray was in 2008 so I will obtain updated films Continue with current pain medication regimen - DG Lumbar Spine Complete; Future  2. Cervical radiculopathy Uncontrolled with associated upper extremity weakness I have prescribed premedication for him prior to his MRI Advised to follow-up with his orthopedic for definitive management Continue with current pain medication regimen - LORazepam (ATIVAN) 0.5 MG tablet; Take 1 tablet (0.5 mg total) by mouth once for 1 dose. 30 minutes prior to MRI  Dispense: 1 tablet; Refill: 0  3. Scrotal swelling History of trauma 2 months ago - US Scrotum; Future - POCT URINALYSIS DIP (CLINITEK)  4. Screening for colon cancer - Ambulatory referral to Gastroenterology  5. Gross hematuria UA positive for UTI Will send of culture and treat accordingly - Urine Culture  6. Elevated blood pressure reading in office without diagnosis of hypertension No known diagnosis of hypertension but he endorses smoking just before this visit and  ingestion of high sodium foods He will work on his lifestyle and we will reassess this at his next visit.    Meds ordered this encounter  Medications   LORazepam (ATIVAN) 0.5 MG tablet    Sig: Take 1 tablet (0.5 mg total) by mouth once for 1 dose. 30 minutes prior to MRI    Dispense:  1 tablet    Refill:  0    Follow-up: Return in about 3 months (around 10/22/2022) for Coordination of care.       Charlott Rakes, MD, FAAFP. [facility], [place]   07/23/2022, 5:53 PM

## 2022-07-23 NOTE — Progress Notes (Signed)
Pain in right upper and lower back Neurological concern

## 2022-07-24 ENCOUNTER — Telehealth: Payer: Self-pay | Admitting: Family Medicine

## 2022-07-24 ENCOUNTER — Other Ambulatory Visit: Payer: Self-pay

## 2022-07-24 NOTE — Telephone Encounter (Signed)
Routing to PCP for review.

## 2022-07-24 NOTE — Telephone Encounter (Signed)
Copied from [place] [id number]. Topic: General - Other >> Jul 24, 2022  2:38 PM Everette C wrote: Reason for CRM: Elmyra Ricks with [facility] has called for additional clarity regarding the patient's orders for ultrasound of the scrotum   [facility] would like to know if the imaging orders are with or without doppler  Please contact further when possible

## 2022-07-25 LAB — URINE CULTURE

## 2022-07-25 NOTE — Telephone Encounter (Signed)
Order has been changed to [place] with doppler and scheduled

## 2022-07-25 NOTE — Telephone Encounter (Signed)
Can you please call [facility] to clarify orders and replace if appropriate?  Ultrasound of scrotum with Doppler.  Thank you

## 2022-08-10 ENCOUNTER — Other Ambulatory Visit: Payer: BLUE CROSS/BLUE SHIELD

## 2022-09-23 ENCOUNTER — Telehealth: Payer: Self-pay | Admitting: *Deleted

## 2022-09-23 NOTE — Telephone Encounter (Unsigned)
Dr.Newlin placed referral order for pt to see [facility] left note 09/09/22 trying to contact pt - this caller left msg to call to connect to GI office to f/u test Dr. Margarita Rana ordered for him.

## 2022-09-26 ENCOUNTER — Telehealth: Payer: Self-pay | Admitting: *Deleted

## 2022-09-26 NOTE — Telephone Encounter (Signed)
Caller indentifies herself as Nicholas Roth, a close friend of pt who is trying to help him get the CT scan and colonoscopy Dr. Margarita Rana has ordered. Nicholas Roth states she is out of the country and will be returning in 2 weeks and will be coming with pt to see Dr. Margarita Rana at his 10/22/22 appt. Nicholas Roth states pt was told he "did not have insurance and therefore could not get the CT." Nicholas Roth asked about insurance on pt record, which currently shows pt as having [employer] insurance as primary and Medicaid [employer] as secondary. Caller shared that insurance verification would help as the [employer] insurance sometimes required the pt to use [facility] facilities. However, with the expansion of Medicaid, pt might now be able to use all the [facility] facilities, if he has maintained coverage (or reapplied) for the expanded Medicaid. Nicholas Roth asked for (and was given )[facility] phone number and states she will call both [facility] and the radiology site to try to make pt the needed appts and will f/u about insurance and discuss issues she encounters with Dr. Margarita Rana at the next pt appt for Nicholas Roth on 10/22/22.

## 2022-09-27 ENCOUNTER — Emergency Department (HOSPITAL_COMMUNITY): Payer: BLUE CROSS/BLUE SHIELD

## 2022-09-27 ENCOUNTER — Encounter (HOSPITAL_COMMUNITY): Payer: Self-pay

## 2022-09-27 ENCOUNTER — Other Ambulatory Visit: Payer: Self-pay

## 2022-09-27 ENCOUNTER — Emergency Department (HOSPITAL_COMMUNITY)
Admission: EM | Admit: 2022-09-27 | Discharge: 2022-09-27 | Disposition: A | Discharge status: Home / Self Care | Payer: BLUE CROSS/BLUE SHIELD | Attending: Emergency Medicine | Admitting: Emergency Medicine

## 2022-09-27 DIAGNOSIS — M545 Low back pain, unspecified: Secondary | ICD-10-CM | POA: Diagnosis not present

## 2022-09-27 DIAGNOSIS — W06XXXA Fall from bed, initial encounter: Secondary | ICD-10-CM | POA: Diagnosis not present

## 2022-09-27 DIAGNOSIS — R42 Dizziness and giddiness: Secondary | ICD-10-CM | POA: Insufficient documentation

## 2022-09-27 DIAGNOSIS — W19XXXA Unspecified fall, initial encounter: Secondary | ICD-10-CM

## 2022-09-27 DIAGNOSIS — M542 Cervicalgia: Secondary | ICD-10-CM | POA: Insufficient documentation

## 2022-09-27 DIAGNOSIS — M546 Pain in thoracic spine: Secondary | ICD-10-CM | POA: Diagnosis not present

## 2022-09-27 LAB — CBC WITH DIFFERENTIAL/PLATELET
Abs Immature Granulocytes: 0.01 10*3/uL (ref 0.00–0.07)
Basophils Absolute: 0 10*3/uL (ref 0.0–0.1)
Basophils Relative: 1 %
Eosinophils Absolute: 0.1 10*3/uL (ref 0.0–0.5)
Eosinophils Relative: 2 %
HCT: 45.5 % (ref 39.0–52.0)
Hemoglobin: 15.1 g/dL (ref 13.0–17.0)
Immature Granulocytes: 0 %
Lymphocytes Relative: 57 %
Lymphs Abs: 2.8 10*3/uL (ref 0.7–4.0)
MCH: 32 pg (ref 26.0–34.0)
MCHC: 33.2 g/dL (ref 30.0–36.0)
MCV: 96.4 fL (ref 80.0–100.0)
Monocytes Absolute: 0.8 10*3/uL (ref 0.1–1.0)
Monocytes Relative: 16 %
Neutro Abs: 1.2 10*3/uL — ABNORMAL LOW (ref 1.7–7.7)
Neutrophils Relative %: 24 %
Platelets: 243 10*3/uL (ref 150–400)
RBC: 4.72 MIL/uL (ref 4.22–5.81)
RDW: 14.6 % (ref 11.5–15.5)
WBC: 5 10*3/uL (ref 4.0–10.5)
nRBC: 0 % (ref 0.0–0.2)

## 2022-09-27 LAB — BASIC METABOLIC PANEL
Anion gap: 11 (ref 5–15)
BUN: 13 mg/dL (ref 6–20)
CO2: 24 mmol/L (ref 22–32)
Calcium: 9.1 mg/dL (ref 8.9–10.3)
Chloride: 99 mmol/L (ref 98–111)
Creatinine, Ser: 0.97 mg/dL (ref 0.61–1.24)
GFR, Estimated: >60 mL/min (ref >60)
Glucose, Bld: 102 mg/dL — ABNORMAL HIGH (ref 70–99)
Potassium: 3.6 mmol/L (ref 3.5–5.1)
Sodium: 134 mmol/L — ABNORMAL LOW (ref 135–145)

## 2022-09-27 NOTE — ED Provider Triage Note (Signed)
Emergency Medicine Provider Triage Evaluation Note  Nicholas Roth , a 62 y.o. male  was evaluated in triage.  Pt complains of fall.  States he was getting out of bed at approximately 8:00 this morning when his back locked up.  Does not know how he fell.  May have had a syncopal episode.  Remembers hitting his neck and upper back on a table.  Does not take blood thinners.  Reports midline and right-sided neck pain as well as pain to the entirety of his back.  Denies numbness, weakness, tingling, bowel or bladder dysfunction.  He was able to get himself up off the floor.  Denies headaches or vision changes  Review of Systems  Positive: As above Negative: As above  Physical Exam  BP 107/79   Pulse 79   Temp 97.9 F (36.6 C) (Oral)   Ht 5\' 8"  (1.727 m)   Wt 70.3 kg   SpO2 100%   BMI 23.57 kg/m  Gen:   Awake, no distress   Resp:  Normal effort  MSK:   Moves extremities without difficulty  Other:  Midline and right-sided posterior neck pain.  Pupils equal and reactive bilaterally  Medical Decision Making  Medically screening exam initiated at 12:57 PM.  Appropriate orders placed.  Nicholas Roth was informed that the remainder of the evaluation will be completed by another provider, this initial triage assessment does not replace that evaluation, and the importance of remaining in the ED until their evaluation is complete.  Patient labs, CT head and C-spine ordered   Nicholas Roth, Nicholas Roth 09/27/22 1259

## 2022-09-27 NOTE — Discharge Instructions (Signed)
As discussed, your evaluation today has been largely reassuring.  But, it is important that you monitor your condition carefully, and do not hesitate to return to the ED if you develop new, or concerning changes in your condition. ? ?Otherwise, please follow-up with your physician for appropriate ongoing care. ? ?

## 2022-09-27 NOTE — ED Triage Notes (Signed)
Pt came in via POV d/t a fell that occurred at 0800 when getting OOB. Pt states his back "locked up" & he fell backwards & is not sure if he hit his head, neck or both on a "food tray." Endorses LOC & when he woke up he was laying on his back. No injuries from the fall, was able to get out of floor alone. Lower back pain & dizziness when he stood. Does NOT take thinners.

## 2022-09-27 NOTE — ED Provider Notes (Signed)
[facility] Provider Note   CSN: [id number] Arrival date & time: 09/27/22  1227     History  Chief Complaint  Patient presents with   Fall   Back Pain   Neck Pain    Nicholas Roth is a 62 y.o. male.  HPI Patient presents after a fall.  There is questionable syncope, but the patient notes that he was getting out of bed, when he fell.  Since that time he said pain in the neck, upper back, no weakness in any extremity, no chest pain, no incontinence.     Home Medications Prior to Admission medications   Medication Sig Start Date End Date Taking? Authorizing Provider  acetaminophen (TYLENOL) 500 MG tablet Take 500 mg by mouth every 6 (six) hours as needed for moderate pain.    [provider]  ALPRAZolam Duanne Moron) 1 MG tablet Take one tablet 30 min prior to MRI 10/25/20   Elsie Stain, MD  diazepam (VALIUM) 5 MG tablet Take 2 tablets 1 hr before MRI scan. Take extra tablet with you 07/25/21   Marybelle Killings, MD  DULoxetine (CYMBALTA) 60 MG capsule Take 1 capsule (60 mg total) by mouth daily. 04/22/22   Charlott Rakes, MD  gabapentin (NEURONTIN) 300 MG capsule TAKE 1 CAPSULE (300 MG TOTAL) BY MOUTH 2 (TWO) TIMES DAILY. 04/22/22 04/22/23  Charlott Rakes, MD  lidocaine (LIDODERM) 5 % Place 1 patch onto the skin daily as needed. Apply patch to area most significant pain once per day.  Remove and discard patch within 12 hours of application. 04/22/22   Charlott Rakes, MD  metaxalone (SKELAXIN) 800 MG tablet Take 1 tablet (800 mg total) by mouth 3 (three) times daily. 04/22/22   Charlott Rakes, MD  nicotine (NICODERM CQ) 14 mg/24hr patch Place 1 patch (14 mg total) onto the skin daily. 01/11/21   Charlott Rakes, MD  Vitamin D, Ergocalciferol, (DRISDOL) 1.25 MG (50000 UNIT) CAPS capsule Take 1 capsule (50,000 Units total) by mouth every 7 (seven) days. 10/19/21   Argentina Donovan, PA-C      Allergies    Patient has no known  allergies.    Review of Systems   Review of Systems  All other systems reviewed and are negative.   Physical Exam Updated Vital Signs BP 95/70   Pulse (!) 58   Temp 97.9 F (36.6 C) (Oral)   Resp 16   Ht 5\' 8"  (1.727 m)   Wt 70.3 kg   SpO2 96%   BMI 23.57 kg/m  Physical Exam Vitals and nursing note reviewed.  Constitutional:      General: He is not in acute distress.    Appearance: He is well-developed.  HENT:     Head: Normocephalic and atraumatic.  Eyes:     Conjunctiva/sclera: Conjunctivae normal.  Cardiovascular:     Rate and Rhythm: Normal rate and regular rhythm.  Pulmonary:     Effort: Pulmonary effort is normal. No respiratory distress.     Breath sounds: No stridor.  Abdominal:     General: There is no distension.  Musculoskeletal:     Comments: No obvious deformity, patient moves all extremities spontaneously and to command.  Skin:    General: Skin is warm and dry.  Neurological:     General: No focal deficit present.     Mental Status: He is alert and oriented to person, place, and time.     ED Results / Procedures /  Treatments   Labs (all labs ordered are listed, but only abnormal results are displayed) Labs Reviewed  BASIC METABOLIC PANEL - Abnormal; Notable for the following components:      Result Value   Sodium 134 (*)    Glucose, Bld 102 (*)    All other components within normal limits  CBC WITH DIFFERENTIAL/PLATELET - Abnormal; Notable for the following components:   Neutro Abs 1.2 (*)    All other components within normal limits    EKG EKG Interpretation  Date/Time:  Friday September 27 2022 16:14:31 EDT Ventricular Rate:  62 PR Interval:  146 QRS Duration: 97 QT Interval:  406 QTC Calculation: 413 R Axis:   16 Text Interpretation:   Sinus rhythm Confirmed by Carmin Muskrat [phone]) on 09/27/2022 4:37:54 PM  Radiology CT Head Wo Contrast  Result Date: 09/27/2022 CLINICAL DATA:  Head and neck trauma, midline tenderness. EXAM: CT  HEAD WITHOUT CONTRAST CT CERVICAL SPINE WITHOUT CONTRAST TECHNIQUE: Multidetector CT imaging of the head and cervical spine was performed following the standard protocol without intravenous contrast. Multiplanar CT image reconstructions of the cervical spine were also generated. RADIATION DOSE REDUCTION: This exam was performed according to the departmental dose-optimization program which includes automated exposure control, adjustment of the mA and/or kV according to patient size and/or use of iterative reconstruction technique. COMPARISON:  None Available. FINDINGS: CT HEAD FINDINGS Brain: No evidence of acute infarction, hemorrhage, hydrocephalus, extra-axial collection or mass lesion/mass effect. Vascular: No hyperdense vessel or unexpected calcification. Skull: Normal. Negative for fracture or focal lesion. Sinuses/Orbits: No acute finding. Other: None. CT CERVICAL SPINE FINDINGS Alignment: Straightening of the cervical spine. Mild retrolisthesis of C5 and C6. Skull base and vertebrae: No acute fracture. No primary bone lesion or focal pathologic process. Soft tissues and spinal canal: No prevertebral fluid or swelling. No visible canal hematoma. Disc levels: C2-C3: Mild bilateral facet joint arthropathy. No significant spinal canal or neural foraminal stenosis. C3-C4: Moderate left facet joint arthropathy with left neural foraminal stenosis. C4-C5: Moderate left facet joint arthropathy. Mild left neural foraminal stenosis. C5-C6: Disc height loss and uncovertebral joint arthropathy with mild right and moderate left neural foraminal stenosis. Mild left facet joint arthropathy. C6-C7: Disc height loss and uncovertebral joint arthropathy with moderate bilateral neural foraminal stenosis. C7-T1:  No significant finding. Upper chest: Negative. IMPRESSION: CT HEAD: No acute intracranial abnormality. CT CERVICAL SPINE: 1. No acute fracture or traumatic subluxation. 2. Moderate multilevel degenerative disc and joint  disease as described above. Electronically Signed   By: Keane Police D.O.   On: 09/27/2022 13:42   CT Cervical Spine Wo Contrast  Result Date: 09/27/2022 CLINICAL DATA:  Head and neck trauma, midline tenderness. EXAM: CT HEAD WITHOUT CONTRAST CT CERVICAL SPINE WITHOUT CONTRAST TECHNIQUE: Multidetector CT imaging of the head and cervical spine was performed following the standard protocol without intravenous contrast. Multiplanar CT image reconstructions of the cervical spine were also generated. RADIATION DOSE REDUCTION: This exam was performed according to the departmental dose-optimization program which includes automated exposure control, adjustment of the mA and/or kV according to patient size and/or use of iterative reconstruction technique. COMPARISON:  None Available. FINDINGS: CT HEAD FINDINGS Brain: No evidence of acute infarction, hemorrhage, hydrocephalus, extra-axial collection or mass lesion/mass effect. Vascular: No hyperdense vessel or unexpected calcification. Skull: Normal. Negative for fracture or focal lesion. Sinuses/Orbits: No acute finding. Other: None. CT CERVICAL SPINE FINDINGS Alignment: Straightening of the cervical spine. Mild retrolisthesis of C5 and C6. Skull base and vertebrae: No  acute fracture. No primary bone lesion or focal pathologic process. Soft tissues and spinal canal: No prevertebral fluid or swelling. No visible canal hematoma. Disc levels: C2-C3: Mild bilateral facet joint arthropathy. No significant spinal canal or neural foraminal stenosis. C3-C4: Moderate left facet joint arthropathy with left neural foraminal stenosis. C4-C5: Moderate left facet joint arthropathy. Mild left neural foraminal stenosis. C5-C6: Disc height loss and uncovertebral joint arthropathy with mild right and moderate left neural foraminal stenosis. Mild left facet joint arthropathy. C6-C7: Disc height loss and uncovertebral joint arthropathy with moderate bilateral neural foraminal stenosis.  C7-T1:  No significant finding. Upper chest: Negative. IMPRESSION: CT HEAD: No acute intracranial abnormality. CT CERVICAL SPINE: 1. No acute fracture or traumatic subluxation. 2. Moderate multilevel degenerative disc and joint disease as described above. Electronically Signed   By: Keane Police D.O.   On: 09/27/2022 13:42    Procedures Procedures    Medications Ordered in ED Medications - No data to display  ED Course/ Medical Decision Making/ A&P                             Medical Decision Making Patient with chronic cervical spine disease presents after a fall, questionable syncope.  He is awake, alert, describes no chest pain either before or after the fall, has had none, nor any arrhythmia on monitoring here.  He has remained neurologically unremarkable, hemodynamically stable throughout, findings reviewed, including CT, labs, ECG inconsistent with arrhythmia, intracranial or cervical spine injury.  Patient appropriate for discharge with outpatient follow-up.  Amount and/or Complexity of Data Reviewed External Data Reviewed: notes. Labs: ordered. Decision-making details documented in ED Course. Radiology: ordered and independent interpretation performed. Decision-making details documented in ED Course. ECG/medicine tests: ordered and independent interpretation performed. Decision-making details documented in ED Course.  Risk OTC drugs. Decision regarding hospitalization.        Final Clinical Impression(s) / ED Diagnoses Final diagnoses:  Fall, initial encounter    Rx / DC Orders ED Discharge Orders     None         Carmin Muskrat, MD 09/27/22 [phone]

## 2022-10-22 ENCOUNTER — Telehealth: Payer: Self-pay | Admitting: Family Medicine

## 2022-10-22 ENCOUNTER — Other Ambulatory Visit: Payer: Self-pay

## 2022-10-22 ENCOUNTER — Ambulatory Visit: Payer: BLUE CROSS/BLUE SHIELD | Attending: Family Medicine | Admitting: Family Medicine

## 2022-10-22 ENCOUNTER — Telehealth: Payer: Self-pay

## 2022-10-22 ENCOUNTER — Encounter: Payer: Self-pay | Admitting: Family Medicine

## 2022-10-22 VITALS — BP 149/102 | HR 71 | Temp 98.7°F | Ht 68.0 in | Wt 164.6 lb

## 2022-10-22 DIAGNOSIS — Z596 Low income: Secondary | ICD-10-CM | POA: Insufficient documentation

## 2022-10-22 DIAGNOSIS — F32A Depression, unspecified: Secondary | ICD-10-CM | POA: Diagnosis not present

## 2022-10-22 DIAGNOSIS — F419 Anxiety disorder, unspecified: Secondary | ICD-10-CM | POA: Insufficient documentation

## 2022-10-22 DIAGNOSIS — M5412 Radiculopathy, cervical region: Secondary | ICD-10-CM

## 2022-10-22 DIAGNOSIS — G8929 Other chronic pain: Secondary | ICD-10-CM | POA: Diagnosis not present

## 2022-10-22 DIAGNOSIS — I10 Essential (primary) hypertension: Secondary | ICD-10-CM

## 2022-10-22 DIAGNOSIS — N3000 Acute cystitis without hematuria: Secondary | ICD-10-CM

## 2022-10-22 DIAGNOSIS — N390 Urinary tract infection, site not specified: Secondary | ICD-10-CM | POA: Insufficient documentation

## 2022-10-22 DIAGNOSIS — Z125 Encounter for screening for malignant neoplasm of prostate: Secondary | ICD-10-CM

## 2022-10-22 DIAGNOSIS — M5136 Other intervertebral disc degeneration, lumbar region: Secondary | ICD-10-CM

## 2022-10-22 DIAGNOSIS — R0789 Other chest pain: Secondary | ICD-10-CM | POA: Diagnosis not present

## 2022-10-22 DIAGNOSIS — R45851 Suicidal ideations: Secondary | ICD-10-CM | POA: Diagnosis not present

## 2022-10-22 DIAGNOSIS — R309 Painful micturition, unspecified: Secondary | ICD-10-CM

## 2022-10-22 LAB — POCT URINALYSIS DIP (CLINITEK)
Bilirubin, UA: NEGATIVE
Blood, UA: NEGATIVE
Glucose, UA: NEGATIVE mg/dL
Ketones, POC UA: NEGATIVE mg/dL
Nitrite, UA: NEGATIVE
POC PROTEIN,UA: NEGATIVE
Spec Grav, UA: 1.02 (ref 1.010–1.025)
Urobilinogen, UA: 0.2 E.U./dL
pH, UA: 7.5 (ref 5.0–8.0)

## 2022-10-22 MED ORDER — AMLODIPINE BESYLATE 5 MG PO TABS
5.0000 mg | ORAL_TABLET | Freq: Every day | ORAL | 1 refills | Status: DC
  Filled 2022-10-22: qty 90, 90d supply, fill #0
  Filled 2023-02-04: qty 90, 90d supply, fill #1

## 2022-10-22 NOTE — Patient Instructions (Signed)
Managing Your Hypertension Hypertension, also called high blood pressure, is when the force of the blood pressing against the walls of the arteries is too strong. Arteries are blood vessels that carry blood from your heart throughout your body. Hypertension forces the heart to work harder to pump blood and may cause the arteries to become narrow or stiff. Understanding blood pressure readings A blood pressure reading includes a higher number over a lower number: The first, or top, number is called the systolic pressure. It is a measure of the pressure in your arteries as your heart beats. The second, or bottom number, is called the diastolic pressure. It is a measure of the pressure in your arteries as the heart relaxes. For most people, a normal blood pressure is below 120/80. Your personal target blood pressure may vary depending on your medical conditions, your age, and other factors. Blood pressure is classified into four stages. Based on your blood pressure reading, your health care provider may use the following stages to determine what type of treatment you need, if any. Systolic pressure and diastolic pressure are measured in a unit called millimeters of mercury (mmHg). Normal Systolic pressure: below 120. Diastolic pressure: below 80. Elevated Systolic pressure: 120-129. Diastolic pressure: below 80. Hypertension stage 1 Systolic pressure: 130-139. Diastolic pressure: 80-89. Hypertension stage 2 Systolic pressure: 140 or above. Diastolic pressure: 90 or above. How can this condition affect me? Managing your hypertension is very important. Over time, hypertension can damage the arteries and decrease blood flow to parts of the body, including the brain, heart, and kidneys. Having untreated or uncontrolled hypertension can lead to: A heart attack. A stroke. A weakened blood vessel (aneurysm). Heart failure. Kidney damage. Eye damage. Memory and concentration problems. Vascular  dementia. What actions can I take to manage this condition? Hypertension can be managed by making lifestyle changes and possibly by taking medicines. Your health care provider will help you make a plan to bring your blood pressure within a normal range. You may be referred for counseling on a healthy diet and physical activity. Nutrition  Eat a diet that is high in fiber and potassium, and low in salt (sodium), added sugar, and fat. An example eating plan is called the DASH diet. DASH stands for Dietary Approaches to Stop Hypertension. To eat this way: Eat plenty of fresh fruits and vegetables. Try to fill one-half of your plate at each meal with fruits and vegetables. Eat whole grains, such as whole-wheat pasta, brown rice, or whole-grain bread. Fill about one-fourth of your plate with whole grains. Eat low-fat dairy products. Avoid fatty cuts of meat, processed or cured meats, and poultry with skin. Fill about one-fourth of your plate with lean proteins such as fish, chicken without skin, beans, eggs, and tofu. Avoid pre-made and processed foods. These tend to be higher in sodium, added sugar, and fat. Reduce your daily sodium intake. Many people with hypertension should eat less than 1,500 mg of sodium a day. Lifestyle  Work with your health care provider to maintain a healthy body weight or to lose weight. Ask what an ideal weight is for you. Get at least 30 minutes of exercise that causes your heart to beat faster (aerobic exercise) most days of the week. Activities may include walking, swimming, or biking. Include exercise to strengthen your muscles (resistance exercise), such as weight lifting, as part of your weekly exercise routine. Try to do these types of exercises for 30 minutes at least 3 days a week. Do   not use any products that contain nicotine or tobacco. These products include cigarettes, chewing tobacco, and vaping devices, such as e-cigarettes. If you need help quitting, ask your  health care provider. Control any long-term (chronic) conditions you have, such as high cholesterol or diabetes. Identify your sources of stress and find ways to manage stress. This may include meditation, deep breathing, or making time for fun activities. Alcohol use Do not drink alcohol if: Your health care provider tells you not to drink. You are pregnant, may be pregnant, or are planning to become pregnant. If you drink alcohol: Limit how much you have to: 0-1 drink a day for women. 0-2 drinks a day for men. Know how much alcohol is in your drink. In the [place] one drink equals one 12 oz bottle of beer (355 mL), one 5 oz glass of wine (148 mL), or one 1 oz glass of hard liquor (44 mL). Medicines Your health care provider may prescribe medicine if lifestyle changes are not enough to get your blood pressure under control and if: Your systolic blood pressure is 130 or higher. Your diastolic blood pressure is 80 or higher. Take medicines only as told by your health care provider. Follow the directions carefully. Blood pressure medicines must be taken as told by your health care provider. The medicine does not work as well when you skip doses. Skipping doses also puts you at risk for problems. Monitoring Before you monitor your blood pressure: Do not smoke, drink caffeinated beverages, or exercise within 30 minutes before taking a measurement. Use the bathroom and empty your bladder (urinate). Sit quietly for at least 5 minutes before taking measurements. Monitor your blood pressure at home as told by your health care provider. To do this: Sit with your back straight and supported. Place your feet flat on the floor. Do not cross your legs. Support your arm on a flat surface, such as a table. Make sure your upper arm is at heart level. Each time you measure, take two or three readings one minute apart and record the results. You may also need to have your blood pressure checked regularly by  your health care provider. General information Talk with your health care provider about your diet, exercise habits, and other lifestyle factors that may be contributing to hypertension. Review all the medicines you take with your health care provider because there may be side effects or interactions. Keep all follow-up visits. Your health care provider can help you create and adjust your plan for managing your high blood pressure. Where to find more information National Heart, Lung, and Blood Institute: www.nhlbi.nih.gov American Heart Association: www.heart.org Contact a health care provider if: You think you are having a reaction to medicines you have taken. You have repeated (recurrent) headaches. You feel dizzy. You have swelling in your ankles. You have trouble with your vision. Get help right away if: You develop a severe headache or confusion. You have unusual weakness or numbness, or you feel faint. You have severe pain in your chest or abdomen. You vomit repeatedly. You have trouble breathing. These symptoms may be an emergency. Get help right away. Call 911. Do not wait to see if the symptoms will go away. Do not drive yourself to the hospital. Summary Hypertension is when the force of blood pumping through your arteries is too strong. If this condition is not controlled, it may put you at risk for serious complications. Your personal target blood pressure may vary depending on your medical conditions,   your age, and other factors. For most people, a normal blood pressure is less than 120/80. Hypertension is managed by lifestyle changes, medicines, or both. Lifestyle changes to help manage hypertension include losing weight, eating a healthy, low-sodium diet, exercising more, stopping smoking, and limiting alcohol. This information is not intended to replace advice given to you by your health care provider. Make sure you discuss any questions you have with your health care  provider. Document Revised: 03/15/2021 Document Reviewed: 03/15/2021 Elsevier Patient Education  2023 Elsevier Inc.  

## 2022-10-22 NOTE — Telephone Encounter (Signed)
High PHQ-9 score.  Depressed with financial challenges, unable to afford medications, currently in pain.  He is open to psychotherapy.  Cymbalta prescribed.  Thanks.

## 2022-10-22 NOTE — Telephone Encounter (Signed)
I met with the patient and his acquaintance, Nicholas Roth,  when he was in the clinic this afternoon for his appointment. Nicholas Roth explained that she is not family but she said he helped care for her parents so she is trying to assist him as much as she can.  He is homeless and she allows him to come and go from her house and stay when he needs a place to stay.  She said she was recently out of the area and she allowed him to stay at her house while she was gone.   He has no income and she said that she helped him submit an application for disability with [employer].  He has a psychiatric evaluation scheduled.   He was not aware of his insurance coverage.  I was able to confirm that he has a [employer] cabin [employer] that is out of network with [facility].  He was not sure why he has that policy.  He also has [employer]. In addition, he had a [facility] Medicaid card but he did not realize that it was his brother's insurance card. Nicholas Roth was very pleased to learn that he has insurance coverage as he will need assistance with obtaining his medications and will need referrals to specialists.  I explained that I can refer him to the Managed Medicaid Care Management Team to assist him with accessing any additional benefits he may be eligible for.  He was in agreement with placing that referral.  He does not have a phone so Nicholas Roth will be his contact person and will help him navigate his medical care.

## 2022-10-22 NOTE — Addendum Note (Signed)
Addended by: Elsie Lincoln F on: 10/22/2022 05:36 PM   Modules accepted: Orders

## 2022-10-22 NOTE — Progress Notes (Signed)
Subjective:  Patient ID: Nicholas Roth, male    DOB: [birth year]  Age: 62 y.o. MRN: [id number]  CC: Follow-up   HPI Nicholas Roth is a 62 y.o. year old male with a history of cervical radiculopathy, prediabetes (A1c 6.0) here for a follow up visit   Interval History: He presents today for reassessment of his blood pressure.  At his last visit his blood pressure was elevated and still elevated today.  Lifestyle recommendation was made at his last visit.  He is filing for disability and will be seeing a Engineer, watersychiatrist tomorrow. He checked a 3 on his PHQ9 and attributes it to his chronic pain, feeling like he has hit rock bottom with no income and is unable to afford his medications.  Complains he is unable to sleep and has to sleep upright due to his pain. Cymbalta, muscle relaxant and gabapentin appear on his med list but he has been unable to afford his medications due to lack of income.  His sister helps when she can.  He complains of "strings" in his urine but denies presence of urinary frequency.  He had complained of hematuria after scrotal trauma last visit but hematuria has resolved.  Scrotal ultrasound was ordered but he was not able to undergo this due to lack of coverage (even though chart reveals he has [employer]). He did have a single episode of chest pain 4 days ago while attempting to rise from a seated position and this lasted a while in his left parasternal region and resolve spontaneously.  He has not had a repeat episode since then.  He did not have dyspnea or palpitations when that occurred. 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  Family History  Problem Relation Age of Onset   CAD Father     Social History   Socioeconomic History   Marital status: Single    Spouse name: Not on file   Number of children: Not on file   Years of education: Not on file    Highest education level: Not on file  Occupational History   Not on file  Tobacco Use   Smoking status: Every Day   Smokeless tobacco: Not on file  Substance and Sexual Activity   Alcohol use: Yes   Drug use: Yes    Types: Marijuana, Cocaine   Sexual activity: Not on file  Other Topics Concern   Not on file  Social History Narrative   Not on file   Social Determinants of Health   Financial Resource Strain: Not on file  Food Insecurity: Not on file  Transportation Needs: Not on file  Physical Activity: Not on file  Stress: Not on file  Social Connections: Not on file    No Known Allergies  Outpatient Medications Prior to Visit  Medication Sig Dispense Refill   acetaminophen (TYLENOL) 500 MG tablet Take 500 mg by mouth every 6 (six) hours as needed for moderate pain.     ALPRAZolam (XANAX) 1 MG tablet Take one tablet 30 min prior to MRI 1 tablet 0   diazepam (VALIUM) 5 MG tablet Take 2 tablets 1 hr before MRI scan. Take extra tablet with you 3 tablet 0   DULoxetine (CYMBALTA) 60 MG capsule Take 1 capsule (60 mg total) by mouth daily. 30 capsule 3   gabapentin (NEURONTIN) 300 MG capsule TAKE 1 CAPSULE (300 MG TOTAL) BY  MOUTH 2 (TWO) TIMES DAILY. 60 capsule 3   lidocaine (LIDODERM) 5 % Place 1 patch onto the skin daily as needed. Apply patch to area most significant pain once per day.  Remove and discard patch within 12 hours of application. 30 patch 3   metaxalone (SKELAXIN) 800 MG tablet Take 1 tablet (800 mg total) by mouth 3 (three) times daily. 90 tablet 3   nicotine (NICODERM CQ) 14 mg/24hr patch Place 1 patch (14 mg total) onto the skin daily. 28 patch 2   Vitamin D, Ergocalciferol, (DRISDOL) 1.25 MG (50000 UNIT) CAPS capsule Take 1 capsule (50,000 Units total) by mouth every 7 (seven) days. 16 capsule 0   No facility-administered medications prior to visit.     ROS Review of Systems  Constitutional:  Negative for activity change and appetite change.  HENT:  Negative  for sinus pressure and sore throat.   Respiratory:  Negative for chest tightness, shortness of breath and wheezing.   Cardiovascular:  Negative for chest pain and palpitations.  Gastrointestinal:  Negative for abdominal distention, abdominal pain and constipation.  Genitourinary: Negative.   Musculoskeletal:  Positive for neck pain.  Psychiatric/Behavioral:  Positive for dysphoric mood. Negative for behavioral problems.     Objective:  BP (!) 149/102   Pulse 71   Temp 98.7 F (37.1 C)   Ht 5\' 8"  (1.727 m)   Wt 164 lb 9.6 oz (74.7 kg)   SpO2 99%   BMI 25.03 kg/m      10/22/2022    2:16 PM 09/27/2022    5:15 PM 09/27/2022    4:15 PM  BP/Weight  Systolic BP 149 95 132  Diastolic BP 102 70 94  Wt. (Lbs) 164.6    BMI 25.03 kg/m2        Physical Exam Constitutional:      Appearance: He is well-developed.  Neck:     Comments: Severe restriction in range of motion Cardiovascular:     Rate and Rhythm: Normal rate.     Heart sounds: Normal heart sounds. No murmur heard. Pulmonary:     Effort: Pulmonary effort is normal.     Breath sounds: Normal breath sounds. No wheezing or rales.  Chest:     Chest wall: No tenderness.  Abdominal:     General: Bowel sounds are normal. There is no distension.     Palpations: Abdomen is soft. There is no mass.     Tenderness: There is no abdominal tenderness.  Musculoskeletal:        General: Normal range of motion.     Cervical back: Tenderness present.     Right lower leg: No edema.     Left lower leg: No edema.     Comments: Muscle wasting in right posterior scapular region  Neurological:     Mental Status: He is alert and oriented to person, place, and time.     Comments: Decreased handgrip bilaterally  Psychiatric:     Comments: Dysphoric mood        Latest Ref Rng & Units 09/27/2022   12:58 PM 04/22/2022    3:30 PM 10/18/2021    2:48 PM  CMP  Glucose 70 - 99 mg/dL 355  85  96   BUN 6 - 20 mg/dL 13  19  13    Creatinine 0.61  - 1.24 mg/dL 7.32  2.02  5.42   Sodium 135 - 145 mmol/L 134  139  139   Potassium 3.5 - 5.1 mmol/L 3.6  4.2  3.8   Chloride 98 - 111 mmol/L 99  101  97   CO2 22 - 32 mmol/L 24  21  23    Calcium 8.9 - 10.3 mg/dL 9.1  9.7  9.3   Total Protein 6.0 - 8.5 g/dL  8.7  8.7   Total Bilirubin 0.0 - 1.2 mg/dL  0.5  0.8   Alkaline Phos 44 - 121 IU/L  56  60   AST 0 - 40 IU/L  88  134   ALT 0 - 44 IU/L  78  123     Lipid Panel  No results found for: "CHOL", "TRIG", "HDL", "CHOLHDL", "VLDL", "LDLCALC", "LDLDIRECT"  CBC    Component Value Date/Time   WBC 5.0 09/27/2022 1258   RBC 4.72 09/27/2022 1258   HGB 15.1 09/27/2022 1258   HGB 13.8 01/11/2021 1002   HCT 45.5 09/27/2022 1258   HCT 40.4 01/11/2021 1002   PLT 243 09/27/2022 1258   PLT 225 01/11/2021 1002   MCV 96.4 09/27/2022 1258   MCV 94 01/11/2021 1002   MCH 32.0 09/27/2022 1258   MCHC 33.2 09/27/2022 1258   RDW 14.6 09/27/2022 1258   RDW 13.7 01/11/2021 1002   LYMPHSABS 2.8 09/27/2022 1258   LYMPHSABS 2.9 01/11/2021 1002   MONOABS 0.8 09/27/2022 1258   EOSABS 0.1 09/27/2022 1258   EOSABS 0.1 01/11/2021 1002   BASOSABS 0.0 09/27/2022 1258   BASOSABS 0.0 01/11/2021 1002    Lab Results  Component Value Date   HGBA1C 6.1 (H) 10/18/2021    Assessment & Plan:  1. Cervical radiculopathy Uncontrolled Unable to pick up Cymbalta, gabapentin and muscle relaxant due to financial constraints Case manager called in to speak with the patient to discuss resources available to assist in medication adherence Currently under orthopedic care and unfortunately is unable to undergo surgery due to lack of medical coverage  2. Other chest pain EKG revealed no ST changes blood moderate voltage criteria for LVH   3. Acute cystitis without hematuria UTI present Will send of culture and treat according to sensitivity - Urinalysis, microscopic only - Urine Culture  4. Screening for prostate cancer - PSA, total and free  5. Primary  hypertension New diagnosis Counseled on blood pressure goal of less than 130/80, low-sodium, DASH diet, medication compliance, 150 minutes of moderate intensity exercise per week. Discussed medication compliance, adverse effects. - amLODipine (NORVASC) 5 MG tablet; Take 1 tablet (5 mg total) by mouth daily.  Dispense: 90 tablet; Refill: 1  6.  Anxiety and depression Explored his symptoms of depression and suicidal thoughts.  He denies having a plan.  Symptoms stem from his financial constraints, chronic pain He is open to psychotherapy and I have referred him to the LCSW.  Meds ordered this encounter  Medications   amLODipine (NORVASC) 5 MG tablet    Sig: Take 1 tablet (5 mg total) by mouth daily.    Dispense:  90 tablet    Refill:  1    Follow-up: Return in about 3 months (around 01/21/2023) for Chronic medical conditions.       Hoy Register, MD, FAAFP. [facility] [place], [place] [phone]   10/22/2022, 4:23 PM

## 2022-10-22 NOTE — Telephone Encounter (Signed)
Admits to suicidal ideations.  On further exploration he has no plan.  Symptoms stem from his pain and financial difficulties.  Currently on Cymbalta please evaluate.

## 2022-10-23 LAB — PSA, TOTAL AND FREE
PSA, Free Pct: 27.5 %
PSA, Free: 0.11 ng/mL
Prostate Specific Ag, Serum: 0.4 ng/mL (ref 0.0–4.0)

## 2022-10-23 LAB — URINALYSIS, MICROSCOPIC ONLY
Casts: NONE SEEN /lpf
RBC, Urine: NONE SEEN /hpf (ref 0–2)

## 2022-10-24 ENCOUNTER — Other Ambulatory Visit: Payer: Self-pay

## 2022-10-24 ENCOUNTER — Other Ambulatory Visit: Payer: Self-pay | Admitting: Family Medicine

## 2022-10-24 LAB — URINE CULTURE: Organism ID, Bacteria: NO GROWTH

## 2022-10-24 MED ORDER — CIPROFLOXACIN HCL 500 MG PO TABS
500.0000 mg | ORAL_TABLET | Freq: Two times a day (BID) | ORAL | 0 refills | Status: AC
  Filled 2022-10-24: qty 6, 3d supply, fill #0

## 2022-10-25 ENCOUNTER — Other Ambulatory Visit: Payer: Self-pay | Admitting: Family Medicine

## 2022-10-25 ENCOUNTER — Other Ambulatory Visit: Payer: Self-pay

## 2022-10-25 MED ORDER — TRAZODONE HCL 50 MG PO TABS
50.0000 mg | ORAL_TABLET | Freq: Every evening | ORAL | 1 refills | Status: AC | PRN
  Filled 2022-10-25: qty 30, 30d supply, fill #0
  Filled 2023-02-04: qty 30, 30d supply, fill #1

## 2022-10-28 NOTE — Telephone Encounter (Signed)
LCSWA called patient today to introduce herself and to assess patients' mental health needs. Patient did not answer the phone. LCSWA was able to leave a brief message with the patient asking them to return the call. Patient was referred by PCP for  "High PHQ-9 score.  Depressed with financial challenges, unable to afford medications, currently in pain.  He is open to psychotherapy.  Cymbalta prescribed."  Advocacy/Legal Legal Aid [place]:  [phone]  /  [phone] /  LVM, taking clients  [facility]:  [phone] /  Onsite, counseling with Johny Shears is virtual, Accepting new clients   Family Service of the [employer] 24-hr Crisis line:  (336)116-8532 Virtual & Onsite services (Client preference), Accepting New clients  [employer], [place]:  [phone] Virtual & Onsite services (Client preference), Accepting New clients  Court Watch (custody):  [phone] Virtual, Accepting new clients  [employer] [facility]:   [phone] Virtual/Telephone, accepting clients for waitlisting (time depends on services)   Baby & Breastfeeding [place] [facility] [phone] Outpatient consultant out for weeks (will be hard to get an appointment) , Support group offered Virtually (Accepting new members)  [facility] Lactation [phone] Telephone & Onsite services (Client preference), Accepting New clients  WIC: 610-344-2938 ([place]);  [phone] ([place]) Virtual  [place] League:  [phone]     Childcare [place] [facility]: ([phone] ([place]) / ([phone] ([place])             - Child Care Resources/ Referrals/ Scholarships             - Head Start/ Early Head Start (call or apply online) Virtually (by appointment), Accepting new families  [place] DHHS: [place] Pre-K :  [phone] / [phone]     Employment / Job Search [employer] of [place]: [phone] / [address] Human resources officer (Client preference), Accepting New clients  [employer]  Career Center (JobLink): [phone] ([place]) / [phone] ([place]) Virtual & Onsite workshops, Accepting new clients  [employer] laboratory technician Resource/ Career Center: [phone] / [phone] Virtual & Onsite , Accepting New clients  [facility] Job & Career Center: [phone]   DHHS Work First: [phone] ([place]) / [phone] ([place]) Virtually, Accepting clients   [facility] [place]:  5623863690  Virtual and Onsite, Accepting new clients     Financial Assistance [facility]:  (818) [phone] Virtual (financial assistance) & Onsite (all other services), Accepting new families  [employer]: (548)732-9325 [employer] (furniture):  [phone]   [facility] Helping Hands: [phone]   Low Income Energy Assistance: (401)103-0890 Virtual, accepting new families    Food Assistance DHHS- SNAP/ Food Stamps: 820-430-7307 Virtual  WIC: [place]678-827-0928 ;  [place] (410)[phone] Virtual        During the summer, text "FOOD" to 275170     General Health / Clinics (Adults) Orange Card (for Adults) through [facility]: [phone]    [facility]:   [phone]   [facility] Wellness:   [phone]   Health Department:  [phone]   [facility]:  [phone] / [phone]   [facility]anned [facility] of [place]:   361-757-7695 Onsite, Accepting new patients  [facility]:   [phone] Onsite , Accepting new patients    Housing [facility] Housing Coalition:   754-350-2477   [facility] Housing Authority:  [phone]   Affordable Housing Managemnt:  (575) [phone]     Immigrant/ Refugee Center for [facility]):  [phone] Onsite, Accepting  new people  Pensions consultant House:  [phone] Virtual, accepting new individuals  [employer]:  (567) [phone] Onsite & Virtual, Accepting new individuals  Parks Ranger Services:   9526012539 Virtual, Accepting new clients  African Services Coalition:  270-568-5098     LGBTQ Youth SAFE  www.youthsafe[place].org  Virtual, Accepting new members  PFLAG  520-758-9513 / info@[place] .org  Virtual, Accepting new Members  [facility]:  (209)[phone]  Virtual    Mental Health/ Substance Use Family Service of the [facility]  ([phone] [address] [place] [address] Virtual & Onsite services (Client preference), Accepting New clients  [facility]:  [phone] or [phone] [address] [place], [place] [address] Onsite & Virtual, Accepting new clients  [facility]:  ([phone] [address] [place], [place] [address] Virtual & Onsite, Accepting new clients  [facility]:  [phone] Rozanna Boer [place], [place]) [address] [place] [address] [place], [place] [address] Onsite & Virtual, Accepting new clients  [place] (walk-ins)  [phone] / [address]   [place]:  [phone] Virtual meetings via Zoom- need meeting passcode- call [phone] to receive code "AFG"= [place]-An[place] Family Group  [place].[email or web address]/find-meetings   Alcoholics Anonymous:  (709)071-7420 TonerProviders.com.cy  Narcotics Anonymous:  407-773-8499 24 hour helpline: 930-264-7351  Quit Smoking Hotline:  800-QUIT-NOW 7155453653)    [facility]                                              [phone] [address] [place], [place], [place] [address] Onsite & Virtual, Accepting new clients    COUNSELING AGENCIES in [place] (Accepting Medicaid)   Mental Health  (* = Spanish available;  + = Psychiatric services) * Family Service of the [place]                                [phone] [address] [place], [place] [address] Virtual & Onsite services (Client preference), Accepting New clients  *+ [facility]:                                        (810)733-9348 or 1-463-552-4984 Virtual & Onsite, Accepting clients  +[place]  [facility] Total Access Care                                [phone]   [facility]:                                                 [phone]   + [facility]:                                           [phone] Onsite & Virtual, Accepting new clients  [facility]                               (  [phone] Onsite, Accepting new clients  * Family Solutions:                                                     9385816042 Virtual, NOT accepting new clients  The Social Emotional Learning (SEL) Group           [phone] Virtual, accepting new clients  Youth Focus:                                                            ([phone] Onsite & Virtual, Accepting new clients  [facility]:                                        [phone] Onsite & Virtual, Waitlist 6-8 months for services  [place] [facility]:                             629-165-8987   *[facility]                                                [phone] Onsite & Virtual, Accepting new clients  + [facility]:             801-645-8804 or ([phone]   [facility]                                                    [phone] Onsite & Virtual, Accepting new clients  *+ [place] [employer]er (walk-ins)                                                (307)[phone] / [address]   [facility]                                              [phone] Onsite & Virtual, Accepting new clients  Youth Unlimited (PCIT)                                              434-034-6680 Onsite & Virtual, At Capacity (check in occasionally , subject to change)    Substance Use [place]:  [phone]  Alcoholics Anonymous:      (616) 195-3104  Narcotics Anonymous:       217-328-7685  Quit Smoking Hotline:         800-QUIT-NOW (760)543-9452[phone] Provides information on mental  health, intellectual/developmental disabilities & substance abuse services in [facility]     Parenting Children's Home Society:  [phone] Virtual , Accepting families  YWCA: [phone]   UNCG: Bringing Out the Best:  [phone]              Thriving at Three (Hispanic families): ([phone] [phone] Onsite, Accepting new children ( short wait list)  Healthy Start (Family Service of the [place]):  (620)268-1287 [phone]   Parents as Teachers:  [phone] Virtually, accepting families ( waitlist for Spanish speaking families )  [place] Child Counsellor- Learning Together (Immigrants): 3104925414     Poison Control (906)618-7326   Sports & Recreation [employer] Open Doors Application: https://www.rich.com/ Onsite, Accepting new families  [place] Recreation Centers: http://www.[place].gov/index.aspx?page=3615 Onsite    Special Needs Family Support Network:  781-247-6229 Virtual, Accepting new families  Autism Society of [place]:   (334)767-2047 563-465-5241 or [phone] /  [phone] Virtual, Accepting new families   [facility]:  [phone] Virtual, Accepting families  ARC of [place]:  [phone] Virtual, Accepting new families  Children's Developmental Service Agency (CDSA):  352-360-0530 Virtual, Accepting new families  CC4C (Care Coordination for Children):  418-485-5587 Virtual, accepting new patients     Transportation Medicaid Transportation: 418-409-6656 to apply  [place] Authority: 239-241-6108 (reduced-fare bus ID to Medicaid/ Medicare/ Orange Card)  SCAT Paratransit services: Eligible riders only, call 2400482805 for application    Tutoring/ Mentoring [employer]: 514-144-0360 No tutoring only afterschool programming (In Person), Accepting new students  Big Brothers/ Big Sisters: 7312623984 Manley Mason)  [phone] ([place])   ACES through child's school: (386) [phone]   [employer] Achievers: contact your local Y In  Person, Accepting New students  SHIELD Mentor Program: (610) [phone] Will re-launch in the fall   Updated 10/2019

## 2022-10-28 NOTE — Telephone Encounter (Signed)
Called pt multiple times Friday and today with no response left voicemail and resources on previous note.

## 2022-11-11 ENCOUNTER — Telehealth: Payer: Self-pay

## 2022-11-11 DIAGNOSIS — M5136 Other intervertebral disc degeneration, lumbar region: Secondary | ICD-10-CM

## 2022-11-11 NOTE — Telephone Encounter (Signed)
I spoke to patient's friend, Haywood Lasso regarding follow up with GI.  Haywood Lasso said she has not had a chance to call GI but will call this week. She said that the patient is " stable" now and she does not have any concerns.  She said he has all of his medications and he has been in contact with a case worker to submit the disability application.   Haywood Lasso said she has not heard from [facility] care management yet. I placed a referral on 4/9 and will re-send another one today.  Haywood Lasso is interested in the other benefits he may be eligible for, including dental coverage.  I explained to her that she can cal member services on the back of his insurance card and inquire about dental coverage.

## 2022-11-14 ENCOUNTER — Ambulatory Visit
Admission: RE | Admit: 2022-11-14 | Discharge: 2022-11-14 | Disposition: A | Discharge status: Home / Self Care | Payer: BLUE CROSS/BLUE SHIELD | Source: Ambulatory Visit | Attending: Family Medicine | Admitting: Family Medicine

## 2022-11-14 DIAGNOSIS — N5089 Other specified disorders of the male genital organs: Secondary | ICD-10-CM

## 2022-12-19 ENCOUNTER — Ambulatory Visit: Payer: Self-pay

## 2022-12-19 NOTE — Telephone Encounter (Signed)
2nd attempt, Patient's wife called, left VM to return the call to the office to speak to the NT.   Summary: UTI sx advice   Pts signfiant other is calling to report that the patient was given medication for UTI sx. Medication has not cleared up the sx. Please advise

## 2022-12-19 NOTE — Telephone Encounter (Signed)
Pts signfiant other is calling to report that the patient was given medication for UTI sx. Medication has not cleared up the sx. Please advise    Left message to call back about symptoms.

## 2022-12-19 NOTE — Telephone Encounter (Signed)
  Chief Complaint: cloudy urine Symptoms: burning with urination Frequency: few days Pertinent Negatives: Patient denies fever, abd pain  Disposition: [] ED /[x] Urgent Care (no appt availability in office) / [] Appointment(In office/virtual)/ []  [facility]/ [] Home Care/ [] Refused Recommended Disposition /[] Day Mobile Bus/ []  Follow-up with PCP Additional Notes: to UC discussed with Cassandra and no appts in office .  Reason for Disposition . All other males with painful urination  Answer Assessment - Initial Assessment Questions 1. SYMPTOM: "What's the main symptom you're concerned about?" (e.g., frequency, incontinence)     Burning, cloudy urine 3. PAIN: "Is there any pain?" If Yes, ask: "How bad is it?" (Scale: 1-10; mild, moderate, severe)     Yes  4. CAUSE: "What do you think is causing the symptoms?"     UTI 5. OTHER SYMPTOMS: "Do you have any other symptoms?" (e.g., blood in urine, fever, flank pain, pain with urination)     none 6. PREGNANCY: "Is there any chance you are pregnant?" "When was your last menstrual period?"     N/a  Answer Assessment - Initial Assessment Questions 1. SEVERITY: "How bad is the pain?"  (e.g., Scale 1-10; mild, moderate, or severe)   - MILD (1-3): Complains slightly about urination hurting.   - MODERATE (4-7): Interferes with normal activities.     - SEVERE (8-10): Excruciating, unwilling or unable to urinate because of the pain.      Pt not with caller 2. FREQUENCY: "How many times have you had painful urination today?"      Pt not with caller 5. ONSET: "When did the painful urination start?"      Few days 6. FEVER: "Do you have a fever?" If Yes, ask: "What is your temperature, how was it measured, and when did it start?"     no 7. PAST UTI: "Have you had a urine infection before?" If Yes, ask: "When was the last time?" and "What happened that time?"      Yes abx 8. CAUSE: "What do you think is causing the painful urination?"       UTI 9. OTHER SYMPTOMS: "Do you have any other symptoms?" (e.g., flank pain, penis discharge, scrotal pain, blood in urine)     Cloudy urine  Protocols used: Urinary Symptoms-A-AH, Urination Pain - Male-A-AH

## 2023-01-02 ENCOUNTER — Ambulatory Visit (HOSPITAL_COMMUNITY)
Admission: EM | Admit: 2023-01-02 | Discharge: 2023-01-02 | Disposition: A | Discharge status: Home / Self Care | Payer: BLUE CROSS/BLUE SHIELD | Attending: Family Medicine | Admitting: Family Medicine

## 2023-01-02 ENCOUNTER — Ambulatory Visit (INDEPENDENT_AMBULATORY_CARE_PROVIDER_SITE_OTHER): Payer: BLUE CROSS/BLUE SHIELD

## 2023-01-02 ENCOUNTER — Encounter (HOSPITAL_COMMUNITY): Payer: Self-pay

## 2023-01-02 DIAGNOSIS — N3090 Cystitis, unspecified without hematuria: Secondary | ICD-10-CM

## 2023-01-02 DIAGNOSIS — M25511 Pain in right shoulder: Secondary | ICD-10-CM | POA: Insufficient documentation

## 2023-01-02 LAB — POCT URINALYSIS DIP (MANUAL ENTRY)
Bilirubin, UA: NEGATIVE
Blood, UA: NEGATIVE
Glucose, UA: NEGATIVE mg/dL
Nitrite, UA: POSITIVE — AB
Protein Ur, POC: NEGATIVE mg/dL
Spec Grav, UA: 1.02 (ref 1.010–1.025)
Urobilinogen, UA: 1 E.U./dL
pH, UA: 7 (ref 5.0–8.0)

## 2023-01-02 MED ORDER — IBUPROFEN 800 MG PO TABS: 800.0000 mg | ORAL_TABLET | Freq: Three times a day (TID) | ORAL | 0 refills | Status: DC

## 2023-01-02 MED ORDER — CEPHALEXIN 500 MG PO CAPS: 500.0000 mg | ORAL_CAPSULE | Freq: Two times a day (BID) | ORAL | 0 refills | Status: DC

## 2023-01-02 NOTE — ED Triage Notes (Addendum)
Patient fell this morning while trying to go to the bathroom. Having right shoulder pain and low back pain. States when he stood up his back locked up and he dropped.   Patient tried to cath himself with the right arm and now has pain in the right shoulder.   Patient states he is having a foul smelling urine, burning with urination, discolored urine, and frequency.

## 2023-01-02 NOTE — ED Provider Notes (Signed)
[facility]   [id number] 01/02/23 Arrival Time: 1928  ASSESSMENT & PLAN:  1. Acute pain of right shoulder   2. Cystitis    I have personally viewed and independently interpreted the imaging studies ordered this visit. No fx of R shoulder appreciated.  Will tx for UTI. Urine culture sent.  New Prescriptions   CEPHALEXIN (KEFLEX) 500 MG CAPSULE    Take 1 capsule (500 mg total) by mouth 2 (two) times daily.   IBUPROFEN (ADVIL) 800 MG TABLET    Take 1 tablet (800 mg total) by mouth 3 (three) times daily with meals.    Orders Placed This Encounter  Procedures   Urine Culture   DG Shoulder Right   POCT urinalysis dipstick   Recommend:  Follow-up Information     Hoy Register, MD.   Specialty: Family Medicine Why: If worsening or failing to improve as anticipated. Contact information: [address] [place] [address] [place] [address] [phone]                  Reviewed expectations re: course of current medical issues. Questions answered. Outlined signs and symptoms indicating need for more acute intervention. Patient verbalized understanding. After Visit Summary given.  SUBJECTIVE: History from: patient. Nicholas Roth is a 62 y.o. male who reports falling this morning while trying to go to the bathroom. Having right shoulder pain and low back pain. States when he stood up his back locked up with soreness. No extremity sensation changes or weakness.   Patient states he is having a foul smelling urine, burning with urination, discolored urine, and frequency.     OBJECTIVE:  Vitals:   01/02/23 1941 01/02/23 1942  BP:  (!) 128/91  Pulse:  91  Resp:  18  Temp:  98.2 F (36.8 C)  TempSrc:  Oral  SpO2:  92%  Weight: 74.8 kg   Height: 5\' 8"  (1.727 m)     General appearance: alert; no distress HEENT: [place]; AT Neck: supple with FROM Resp: unlabored respirations Abd: soft; NT Extremities: RUE: warm with well perfused appearance;  poorly localized moderate tenderness over right anterior shoulder; without gross deformities; swelling: none; bruising: none; shoulder ROM: normal, with discomfort CV: brisk extremity capillary refill of RUE; 2+ radial pulse of RUE. Skin: warm and dry; no visible rashes Neurologic: gait normal; normal sensation and strength of RUE Psychological: alert and cooperative; normal mood and affect  Imaging: DG Shoulder Right  Result Date: 01/02/2023 CLINICAL DATA:  Fall and right shoulder pain. EXAM: RIGHT SHOULDER - 2+ VIEW COMPARISON:  Right shoulder radiograph dated 07/25/2021. FINDINGS: There is no acute fracture or dislocation. The bones are mildly osteopenic. There is mild to moderate arthritic changes of the right shoulder with joint space narrowing and spurring. Bone spurring also noted along the inferior aspect of the [facility] joint. The soft tissues are unremarkable. IMPRESSION: 1. No acute fracture or dislocation. 2. Mild to moderate arthritic changes. Electronically Signed   By: Elgie Collard M.D.   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   Social History   Socioeconomic History   Marital status: Single    Spouse name: Not on file   Number of children: Not on file   Years of education: Not on file   Highest education level: Not on file  Occupational History   Not on file  Tobacco Use  Smoking status: Every Day   Smokeless tobacco: Not on file  Substance and Sexual Activity   Alcohol use: Yes   Drug use: Yes    Types: Marijuana, Cocaine   Sexual activity: Not on file  Other Topics Concern   Not on file  Social History Narrative   Not on file   Social Determinants of Health   Financial Resource Strain: High Risk (10/22/2022)   Overall Financial Resource Strain (CARDIA)    Difficulty of Paying Living Expenses: Very hard  Food Insecurity: Not on file  Transportation Needs: Not on  file  Physical Activity: Not on file  Stress: Not on file  Social Connections: Not on file   Family History  Problem Relation Age of Onset   CAD Father    History reviewed. No pertinent surgical history.     Mardella Layman, MD 01/02/23 2021

## 2023-01-03 LAB — URINE CULTURE: Culture: NO GROWTH

## 2023-02-04 ENCOUNTER — Telehealth: Payer: Self-pay

## 2023-02-04 ENCOUNTER — Ambulatory Visit: Payer: BLUE CROSS/BLUE SHIELD | Admitting: Family Medicine

## 2023-02-04 ENCOUNTER — Other Ambulatory Visit: Payer: Self-pay

## 2023-02-04 ENCOUNTER — Encounter: Payer: Self-pay | Admitting: Family Medicine

## 2023-02-04 VITALS — BP 143/99 | HR 63 | Temp 98.1°F | Ht 68.0 in | Wt 158.8 lb

## 2023-02-04 DIAGNOSIS — R7303 Prediabetes: Secondary | ICD-10-CM

## 2023-02-04 DIAGNOSIS — K0889 Other specified disorders of teeth and supporting structures: Secondary | ICD-10-CM | POA: Diagnosis not present

## 2023-02-04 DIAGNOSIS — M5412 Radiculopathy, cervical region: Secondary | ICD-10-CM

## 2023-02-04 DIAGNOSIS — Z23 Encounter for immunization: Secondary | ICD-10-CM

## 2023-02-04 DIAGNOSIS — I10 Essential (primary) hypertension: Secondary | ICD-10-CM

## 2023-02-04 DIAGNOSIS — M5136 Other intervertebral disc degeneration, lumbar region: Secondary | ICD-10-CM | POA: Diagnosis not present

## 2023-02-04 LAB — POCT GLYCOSYLATED HEMOGLOBIN (HGB A1C): HbA1c, POC (prediabetic range): 6 % (ref 5.7–6.4)

## 2023-02-04 MED ORDER — AMOXICILLIN 500 MG PO CAPS
500.0000 mg | ORAL_CAPSULE | Freq: Three times a day (TID) | ORAL | 0 refills | Status: AC
  Filled 2023-02-04: qty 30, 10d supply, fill #0

## 2023-02-04 NOTE — Patient Instructions (Addendum)
[address]-[phone] [facility]   Dental Abscess  A dental abscess is an infection around a tooth that may involve pain, swelling, and a collection of pus, as well as other symptoms. Treatment is important to help with symptoms and to prevent the infection from spreading. The general types of dental abscesses are: Pulpal abscess. This abscess may form from the inner part of the tooth (pulp). Periodontal abscess. This abscess may form from the gum. What are the causes? This condition is caused by a bacterial infection in or around the tooth. It may result from: Severe tooth decay (cavities). Trauma to the tooth, such as a broken or chipped tooth. What increases the risk? This condition is more likely to develop in males. It is also more likely to develop in people who: Have cavities. Have severe gum disease. Eat sugary snacks between meals. Use tobacco products. Have diabetes. Have a weakened disease-fighting system (immune system). Do not brush and care for their teeth regularly. What are the signs or symptoms? Mild symptoms of this condition include: Tenderness. Bad breath. Fever. A bitter taste in the mouth. Pain in and around the infected tooth. Moderate symptoms of this condition include: Swollen neck glands. Chills. Pus drainage. Swelling and redness around the infected tooth, in the mouth, or in the face. Severe pain in and around the infected tooth. Severe symptoms of this condition include: Difficulty swallowing. Difficulty opening the mouth. Nausea. Vomiting. How is this diagnosed? This condition is diagnosed based on: Your symptoms and your medical and dental history. An examination of the infected tooth. During the exam, your dental care provider may tap on the infected tooth. You may also need to have X-rays taken of the affected area. How is this treated? This condition is treated by getting rid of the infection. This may be done  with: Antibiotic medicines. These may be used in certain situations. Antibacterial mouth rinse. Incision and drainage. This procedure is done by making an incision in the abscess to drain out the pus. Removing pus is the first priority in treating an abscess. A root canal. This may be performed to save the tooth. Your dental care provider accesses the visible part of your tooth (crown) with a drill and removes any infected pulp. Then the space is filled and sealed off. Tooth extraction. The tooth is pulled out if it cannot be saved by other treatment. You may also receive treatment for pain, such as: Acetaminophen or NSAIDs. Gels that contain a numbing medicine. An injection to block the pain near your nerve. Follow these instructions at home: Medicines Take over-the-counter and prescription medicines only as told by your dental care provider. If you were prescribed an antibiotic, take it as told by your dental care provider. Do not stop taking the antibiotic even if you start to feel better. If you were prescribed a gel that contains a numbing medicine, use it exactly as told in the directions. Do not use these gels for children who are younger than 68 years of age. Use an antibacterial mouth rinse as told by your dental care provider. General instructions  Gargle with a mixture of salt and water 3-4 times a day or as needed. To make salt water, completely dissolve -1 tsp (3-6 g) of salt in 1 cup (237 mL) of warm water. Eat a soft diet while your abscess is healing. Drink enough fluid to keep your urine pale yellow. Do not apply heat to the outside of your mouth. Do not use  any products that contain nicotine or tobacco. These products include cigarettes, chewing tobacco, and vaping devices, such as e-cigarettes. If you need help quitting, ask your dental care provider. Keep all follow-up visits. This is important. How is this prevented?  Excellent dental home care, which includes brushing  your teeth every morning and night with fluoride toothpaste. Floss one time each day. Get regularly scheduled dental cleanings. Consider having a dental sealant applied on teeth that have deep grooves to prevent cavities. Drink fluoridated water regularly. This includes most tap water. Check the label on bottled water to see if it contains fluoride. Reduce or eliminate sugary drinks. Eat healthy meals and snacks. Wear a mouth guard or face shield to protect your teeth while playing sports. Contact a health care provider if: Your pain is worse and is not helped by medicine. You have swelling. You see pus around the tooth. You have a fever or chills. Get help right away if: Your symptoms suddenly get worse. You have a very bad headache. You have problems breathing or swallowing. You have trouble opening your mouth. You have swelling in your neck or around your eye. These symptoms may represent a serious problem that is an emergency. Do not wait to see if the symptoms will go away. Get medical help right away. Call your local emergency services (911 in the [place] Do not drive yourself to the hospital. Summary A dental abscess is a collection of pus in or around a tooth that results from an infection. A dental abscess may result from severe tooth decay, trauma to the tooth, or severe gum disease around a tooth. Symptoms include severe pain, swelling, redness, and drainage of pus in and around the infected tooth. The first priority in treating a dental abscess is to drain out the pus. Treatment may also involve removing damage inside the tooth (root canal) or extracting the tooth. This information is not intended to replace advice given to you by your health care provider. Make sure you discuss any questions you have with your health care provider. Document Revised: 09/07/2020 Document Reviewed: 09/07/2020 Elsevier Patient Education  2024 [employer].

## 2023-02-04 NOTE — Progress Notes (Signed)
Subjective:  Patient ID: TAMI BARREN, male    DOB: January 22, 1961  Age: 62 y.o. MRN: [id number]  CC: Back Pain   HPI BAYLEN BUCKNER is a 62 y.o. year old male with a history of cervical radiculopathy, prediabetes (A1c 6.0) here for a follow up visit   Interval History: Discussed the use of AI scribe software for clinical note transcription with the patient, who gave verbal consent to proceed.   The patient, with a history of cervical radiculopathy and recent falls, presents with multiple complaints. He reports difficulty walking and has experienced at least one fall in the past month. He describes ongoing neck and shoulder pain, rating it as an eight on a scale of one to ten. The pain is associated with numbness in the left hand, to the point where he has lost sensation and often drops items without realizing it.  In addition to these symptoms, the patient also reports lower back pain. He has not been able to follow up with recommended tests and treatments, including an MRI and physical therapy, due to financial constraints and lack of insurance. Seen by orthopedics Dr. Barry Dienes and Dr. Ophelia Charter in the past.  The patient also reports dental abscesses that have caused significant pain and difficulty eating, leading to weight loss. He has not been able to seek dental care due to the same financial and insurance issues.  The patient's medications include Cymbalta and gabapentin for pain management, and amlodipine for blood pressure control. He reports that the pain medications do not seem to help with his symptoms. He also has a prescription for trazodone for sleep and depression, which he has not been taking.        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  Family History  Problem Relation Age of Onset   CAD Father     Social History   Socioeconomic History   Marital status: Single     Spouse name: Not on file   Number of children: Not on file   Years of education: Not on file   Highest education level: Not on file  Occupational History   Not on file  Tobacco Use   Smoking status: Every Day   Smokeless tobacco: Not on file  Substance and Sexual Activity   Alcohol use: Yes   Drug use: Yes    Types: Marijuana, Cocaine   Sexual activity: Not on file  Other Topics Concern   Not on file  Social History Narrative   Not on file   Social Determinants of Health   Financial Resource Strain: High Risk (10/22/2022)   Overall Financial Resource Strain (CARDIA)    Difficulty of Paying Living Expenses: Very hard  Food Insecurity: Not on file  Transportation Needs: Not on file  Physical Activity: Not on file  Stress: Not on file  Social Connections: Not on file    No Known Allergies  Outpatient Medications Prior to Visit  Medication Sig Dispense Refill   acetaminophen (TYLENOL) 500 MG tablet Take 500 mg by mouth every 6 (six) hours as needed for moderate pain.     amLODipine (NORVASC) 5 MG tablet Take 1 tablet (5 mg total) by mouth daily. 90 tablet 1   DULoxetine (CYMBALTA) 60 MG capsule Take 1 capsule (60 mg total) by mouth daily. 30 capsule 3   gabapentin (NEURONTIN) 300 MG capsule TAKE 1 CAPSULE (300 MG TOTAL) BY  MOUTH 2 (TWO) TIMES DAILY. 60 capsule 3   ibuprofen (ADVIL) 800 MG tablet Take 1 tablet (800 mg total) by mouth 3 (three) times daily with meals. 21 tablet 0   lidocaine (LIDODERM) 5 % Place 1 patch onto the skin daily as needed. Apply patch to area most significant pain once per day.  Remove and discard patch within 12 hours of application. 30 patch 3   metaxalone (SKELAXIN) 800 MG tablet Take 1 tablet (800 mg total) by mouth 3 (three) times daily. 90 tablet 3   nicotine (NICODERM CQ) 14 mg/24hr patch Place 1 patch (14 mg total) onto the skin daily. 28 patch 2   traZODone (DESYREL) 50 MG tablet Take 1 tablet (50 mg total) by mouth at bedtime as needed for  sleep. 30 tablet 1   Vitamin D, Ergocalciferol, (DRISDOL) 1.25 MG (50000 UNIT) CAPS capsule Take 1 capsule (50,000 Units total) by mouth every 7 (seven) days. 16 capsule 0   ALPRAZolam (XANAX) 1 MG tablet Take one tablet 30 min prior to MRI (Patient not taking: Reported on 02/04/2023) 1 tablet 0   diazepam (VALIUM) 5 MG tablet Take 2 tablets 1 hr before MRI scan. Take extra tablet with you (Patient not taking: Reported on 02/04/2023) 3 tablet 0   cephALEXin (KEFLEX) 500 MG capsule Take 1 capsule (500 mg total) by mouth 2 (two) times daily. (Patient not taking: Reported on 02/04/2023) 14 capsule 0   No facility-administered medications prior to visit.     ROS Review of Systems  Constitutional:  Negative for activity change and appetite change.  HENT:  Negative for sinus pressure and sore throat.   Respiratory:  Negative for chest tightness, shortness of breath and wheezing.   Cardiovascular:  Negative for chest pain and palpitations.  Gastrointestinal:  Negative for abdominal distention, abdominal pain and constipation.  Genitourinary: Negative.   Musculoskeletal:        See HPI  Psychiatric/Behavioral:  Negative for behavioral problems and dysphoric mood.     Objective:  BP (!) 143/99   Pulse 63   Temp 98.1 F (36.7 C) (Oral)   Ht 5\' 8"  (1.727 m)   Wt 158 lb 12.8 oz (72 kg)   SpO2 98%   BMI 24.15 kg/m      02/04/2023   10:08 AM 02/04/2023    9:10 AM 01/02/2023    7:42 PM  BP/Weight  Systolic BP 143 140 128  Diastolic BP 99 90 91  Wt. (Lbs)  158.8   BMI  24.15 kg/m2       Physical Exam Constitutional:      Appearance: He is well-developed.  HENT:     Mouth/Throat:     Comments: Multiple broken teeth, loss of teeth, poor oral hygiene Cardiovascular:     Rate and Rhythm: Normal rate.     Heart sounds: Normal heart sounds. No murmur heard. Pulmonary:     Effort: Pulmonary effort is normal.     Breath sounds: Normal breath sounds. No wheezing or rales.  Chest:      Chest wall: No tenderness.  Abdominal:     General: Bowel sounds are normal. There is no distension.     Palpations: Abdomen is soft. There is no mass.     Tenderness: There is no abdominal tenderness.  Musculoskeletal:     Right shoulder: Tenderness present. Decreased range of motion (unable to abduct).     Left shoulder: Normal range of motion.     Right hand: Decreased  strength.     Left hand: Normal strength.     Lumbar back: Tenderness present. Positive right straight leg raise test and positive left straight leg raise test.     Right lower leg: No edema.     Left lower leg: No edema.  Neurological:     Mental Status: He is alert and oriented to person, place, and time.     Comments: Reduced left hand grip  Psychiatric:        Mood and Affect: Mood normal.        Latest Ref Rng & Units 09/27/2022   12:58 PM 04/22/2022    3:30 PM 10/18/2021    2:48 PM  CMP  Glucose 70 - 99 mg/dL 161  85  96   BUN 6 - 20 mg/dL 13  19  13    Creatinine 0.61 - 1.24 mg/dL 0.96  0.45  4.09   Sodium 135 - 145 mmol/L 134  139  139   Potassium 3.5 - 5.1 mmol/L 3.6  4.2  3.8   Chloride 98 - 111 mmol/L 99  101  97   CO2 22 - 32 mmol/L 24  21  23    Calcium 8.9 - 10.3 mg/dL 9.1  9.7  9.3   Total Protein 6.0 - 8.5 g/dL  8.7  8.7   Total Bilirubin 0.0 - 1.2 mg/dL  0.5  0.8   Alkaline Phos 44 - 121 IU/L  56  60   AST 0 - 40 IU/L  88  134   ALT 0 - 44 IU/L  78  123     Lipid Panel  No results found for: "CHOL", "TRIG", "HDL", "CHOLHDL", "VLDL", "LDLCALC", "LDLDIRECT"  CBC    Component Value Date/Time   WBC 5.0 09/27/2022 1258   RBC 4.72 09/27/2022 1258   HGB 15.1 09/27/2022 1258   HGB 13.8 01/11/2021 1002   HCT 45.5 09/27/2022 1258   HCT 40.4 01/11/2021 1002   PLT 243 09/27/2022 1258   PLT 225 01/11/2021 1002   MCV 96.4 09/27/2022 1258   MCV 94 01/11/2021 1002   MCH 32.0 09/27/2022 1258   MCHC 33.2 09/27/2022 1258   RDW 14.6 09/27/2022 1258   RDW 13.7 01/11/2021 1002   LYMPHSABS 2.8  09/27/2022 1258   LYMPHSABS 2.9 01/11/2021 1002   MONOABS 0.8 09/27/2022 1258   EOSABS 0.1 09/27/2022 1258   EOSABS 0.1 01/11/2021 1002   BASOSABS 0.0 09/27/2022 1258   BASOSABS 0.0 01/11/2021 1002    Lab Results  Component Value Date   HGBA1C 6.0 02/04/2023    Assessment & Plan:      Cervical Radiculopathy: Persistent pain and numbness in the left hand. No MRI of the spine has been completed yet. I have had the case manager meet with both the patient and his sister to review his insurance situation or whether he needs to apply for the [place] financial discount so he can undergo ordered investigations. -Continue current medications (Cymbalta and Gabapentin). -Refer back to [facility] for follow-up.  Lower Back Pain: Pain on palpation and limited mobility. No recent imaging. -Lumbar spine x-ray previously ordered which she is yet to undergo -Consider physical therapy for gait strengthening exercises once insurance status is clarified. -Continue current medications.  Dental Abscess: Recent pain and difficulty eating leading to weight loss. -Prescribe antibiotics. -Refer to dental services once insurance status is clarified.  Prediabetes: Last A1C showed prediabetes. -Check A1C today. -Advise on diet and exercise.  Insurance Status: Unclear insurance  status, impacting access to necessary care. -Consult with case manager to clarify insurance status and apply for [facility] discount if necessary.      Hypertension: Uncontrolled Medication nonadherence contributing       Meds ordered this encounter  Medications   amoxicillin (AMOXIL) 500 MG capsule    Sig: Take 1 capsule (500 mg total) by mouth 3 (three) times daily for 10 days.    Dispense:  30 capsule    Refill:  0    Follow-up: Return in about 3 months (around 05/07/2023) for Chronic medical conditions.       Hoy Register, MD, FAAFP. [facility] [place],  [place] [phone]   02/04/2023, 12:47 PM

## 2023-02-04 NOTE — Telephone Encounter (Signed)
I met with the patient and his friend, Haywood Lasso, when they were in the clinic this morning.  Dr Alvis Lemmings would like to place referrals to specialists but there is still some confusion regarding his insurance coverage.  He has [employer] as well as [employer]. He is not sure how he has the [facility] and it is out of network with [facility]. He would prefer to remain with [facility] providers.   With the patient and Haywood Lasso present, we called [phone] and spoke to [place] C.  She was able to inform him that his application came from the Marketplace and he is not paying anything for the coverage. Policy: [id number].  She said he would need to call the Marketplace to make any changes/cancel this plan.    I explained to the patient and Haywood Lasso that I can refer him to [employer] [place] for assistance with determining if he should keep or terminate this plan as he wants to continue to see [facility] providers.  They were both agreeable to that referral.  Haywood Lasso remains his primary contact.   Haywood Lasso also informed me that they are already working with Donavan Burnet, attorney [facility] to appeal his disability denial.   MLP referral then placed.

## 2023-02-25 NOTE — Telephone Encounter (Signed)
Call placed to patient to inquire if he has been in contact with [facility] to discuss the status of his insurance plans. Message left with call back requested. He has the same phone number as Haywood Lasso, his caregiver

## 2023-03-24 ENCOUNTER — Encounter (HOSPITAL_COMMUNITY): Payer: Self-pay

## 2023-03-24 ENCOUNTER — Ambulatory Visit (HOSPITAL_COMMUNITY)
Admission: EM | Admit: 2023-03-24 | Discharge: 2023-03-24 | Disposition: A | Discharge status: Home / Self Care | Payer: BLUE CROSS/BLUE SHIELD

## 2023-03-24 HISTORY — DX: Essential (primary) hypertension: I10

## 2023-03-24 NOTE — ED Triage Notes (Signed)
Patient here today with c/o having a sharp pain in his low back while walking down steps and he passed out last night. Before passing out, it was light out and when he came to it was dark. Patient also has a knot on his forehead. He states that he didn't have a ride or we would have came here sooner.

## 2023-03-24 NOTE — ED Notes (Signed)
Patient is being discharged from the Urgent Care and sent to the Emergency Department via self . Per provider, patient is in need of higher level of care due to loss of consciousness. Patient is aware and verbalizes understanding of plan of care.  Vitals:   03/24/23 1955  BP: (!) 152/111  Pulse: 80  Resp: 16  Temp: 98.4 F (36.9 C)  SpO2: 95%

## 2023-03-26 ENCOUNTER — Emergency Department (HOSPITAL_COMMUNITY): Payer: BLUE CROSS/BLUE SHIELD

## 2023-03-26 ENCOUNTER — Encounter (HOSPITAL_COMMUNITY): Payer: Self-pay

## 2023-03-26 ENCOUNTER — Emergency Department (HOSPITAL_COMMUNITY)
Admission: EM | Admit: 2023-03-26 | Discharge: 2023-03-26 | Disposition: A | Discharge status: Home / Self Care | Payer: BLUE CROSS/BLUE SHIELD | Attending: Emergency Medicine | Admitting: Emergency Medicine

## 2023-03-26 ENCOUNTER — Other Ambulatory Visit: Payer: Self-pay

## 2023-03-26 DIAGNOSIS — N3001 Acute cystitis with hematuria: Secondary | ICD-10-CM | POA: Diagnosis not present

## 2023-03-26 DIAGNOSIS — Z79899 Other long term (current) drug therapy: Secondary | ICD-10-CM | POA: Insufficient documentation

## 2023-03-26 DIAGNOSIS — R55 Syncope and collapse: Secondary | ICD-10-CM | POA: Insufficient documentation

## 2023-03-26 DIAGNOSIS — E876 Hypokalemia: Secondary | ICD-10-CM | POA: Diagnosis not present

## 2023-03-26 DIAGNOSIS — I10 Essential (primary) hypertension: Secondary | ICD-10-CM | POA: Diagnosis not present

## 2023-03-26 DIAGNOSIS — N200 Calculus of kidney: Secondary | ICD-10-CM | POA: Insufficient documentation

## 2023-03-26 LAB — URINALYSIS, ROUTINE W REFLEX MICROSCOPIC
Bilirubin Urine: NEGATIVE
Glucose, UA: 50 mg/dL — AB
Ketones, ur: 5 mg/dL — AB
Nitrite: POSITIVE — AB
Protein, ur: 100 mg/dL — AB
RBC / HPF: >50 RBC/hpf (ref 0–5)
Specific Gravity, Urine: 1.014 (ref 1.005–1.030)
pH: 6 (ref 5.0–8.0)

## 2023-03-26 LAB — LIPASE, BLOOD: Lipase: 24 U/L (ref 11–51)

## 2023-03-26 LAB — CBC WITH DIFFERENTIAL/PLATELET
Abs Immature Granulocytes: 0.02 10*3/uL (ref 0.00–0.07)
Basophils Absolute: 0 10*3/uL (ref 0.0–0.1)
Basophils Relative: 1 %
Eosinophils Absolute: 0 10*3/uL (ref 0.0–0.5)
Eosinophils Relative: 0 %
HCT: 44.8 % (ref 39.0–52.0)
Hemoglobin: 15.1 g/dL (ref 13.0–17.0)
Immature Granulocytes: 0 %
Lymphocytes Relative: 42 %
Lymphs Abs: 2.3 10*3/uL (ref 0.7–4.0)
MCH: 32.6 pg (ref 26.0–34.0)
MCHC: 33.7 g/dL (ref 30.0–36.0)
MCV: 96.8 fL (ref 80.0–100.0)
Monocytes Absolute: 0.9 10*3/uL (ref 0.1–1.0)
Monocytes Relative: 16 %
Neutro Abs: 2.2 10*3/uL (ref 1.7–7.7)
Neutrophils Relative %: 41 %
Platelets: 225 10*3/uL (ref 150–400)
RBC: 4.63 MIL/uL (ref 4.22–5.81)
RDW: 14.5 % (ref 11.5–15.5)
WBC: 5.5 10*3/uL (ref 4.0–10.5)
nRBC: 0 % (ref 0.0–0.2)

## 2023-03-26 LAB — HEPATIC FUNCTION PANEL
ALT: 66 U/L — ABNORMAL HIGH (ref 0–44)
AST: 62 U/L — ABNORMAL HIGH (ref 15–41)
Albumin: 3.4 g/dL — ABNORMAL LOW (ref 3.5–5.0)
Alkaline Phosphatase: 50 U/L (ref 38–126)
Bilirubin, Direct: 0.1 mg/dL (ref 0.0–0.2)
Indirect Bilirubin: 0.4 mg/dL (ref 0.3–0.9)
Total Bilirubin: 0.5 mg/dL (ref 0.3–1.2)
Total Protein: 8.5 g/dL — ABNORMAL HIGH (ref 6.5–8.1)

## 2023-03-26 LAB — BASIC METABOLIC PANEL
Anion gap: 11 (ref 5–15)
BUN: 12 mg/dL (ref 6–20)
CO2: 24 mmol/L (ref 22–32)
Calcium: 9 mg/dL (ref 8.9–10.3)
Chloride: 98 mmol/L (ref 98–111)
Creatinine, Ser: 0.92 mg/dL (ref 0.61–1.24)
GFR, Estimated: >60 mL/min (ref >60)
Glucose, Bld: 92 mg/dL (ref 70–99)
Potassium: 3.3 mmol/L — ABNORMAL LOW (ref 3.5–5.1)
Sodium: 133 mmol/L — ABNORMAL LOW (ref 135–145)

## 2023-03-26 MED ORDER — HYDROCODONE-ACETAMINOPHEN 5-325 MG PO TABS: 1.0000 | ORAL_TABLET | Freq: Once | ORAL | Status: DC

## 2023-03-26 MED ORDER — HYDROCODONE-ACETAMINOPHEN 5-325 MG PO TABS
1.0000 | ORAL_TABLET | Freq: Four times a day (QID) | ORAL | 0 refills | Status: DC | PRN
Start: 2023-03-26 — End: 2023-05-20

## 2023-03-26 MED ORDER — CEPHALEXIN 500 MG PO CAPS
500.0000 mg | ORAL_CAPSULE | Freq: Four times a day (QID) | ORAL | 0 refills | Status: DC
Start: 2023-03-26 — End: 2023-05-20

## 2023-03-26 MED ORDER — SODIUM CHLORIDE 0.9 % IV SOLN
1.0000 g | Freq: Once | INTRAVENOUS | Status: AC
  Administered 2023-03-26: 1 g via INTRAVENOUS
  Filled 2023-03-26: qty 10

## 2023-03-26 MED ORDER — TAMSULOSIN HCL 0.4 MG PO CAPS: 0.4000 mg | ORAL_CAPSULE | Freq: Every day | ORAL | 0 refills | Status: AC

## 2023-03-26 MED ORDER — PROCHLORPERAZINE EDISYLATE 10 MG/2ML IJ SOLN
10.0000 mg | Freq: Once | INTRAMUSCULAR | Status: AC
  Administered 2023-03-26: 10 mg via INTRAVENOUS
  Filled 2023-03-26: qty 2

## 2023-03-26 MED ORDER — DIPHENHYDRAMINE HCL 50 MG/ML IJ SOLN
12.5000 mg | Freq: Once | INTRAMUSCULAR | Status: AC
  Administered 2023-03-26: 12.5 mg via INTRAVENOUS
  Filled 2023-03-26: qty 1

## 2023-03-26 MED ORDER — SODIUM CHLORIDE 0.9 % IV BOLUS
1000.0000 mL | Freq: Once | INTRAVENOUS | Status: AC
  Administered 2023-03-26: 1000 mL via INTRAVENOUS

## 2023-03-26 NOTE — ED Notes (Signed)
Patient transported to X-ray 

## 2023-03-26 NOTE — ED Triage Notes (Signed)
Pt had syncopal episode 2 days ago while standing on concrete after standing up from sitting; states back locked up and he passed out; evaluated at urgent care, was told to come to ED for further evaluation; pt states he did not come to ED due to being in too much pain; c/o L shoulder, L neck pain, knot to forehead, and lower back pain; denies fevers; states urine has been dark; pt not on thinners; pt a and o x 4, no neuro deficits, denies cp, denies sob

## 2023-03-26 NOTE — ED Notes (Signed)
Called lab to add on HFP and lipase 

## 2023-03-26 NOTE — Discharge Instructions (Addendum)
It was a pleasure taking care of you today.  As discussed, your CT scan showed a 6 mm kidney stone.  I have included the number of urology.  Please call to schedule an appointment for further evaluation.  Your urine showed a possible infection.  I am sending you home with antibiotics.  Take as prescribed and finish all antibiotics.  Your other imaging was reassuring.  No traumatic injuries.  I am sending home with pain medication.  Take as needed.

## 2023-03-26 NOTE — ED Provider Notes (Signed)
[place] EMERGENCY DEPARTMENT AT [facility] Provider Note   CSN: [id number] Arrival date & time: 03/26/23  4166     History  No chief complaint on file.   Nicholas Roth is a 62 y.o. male with a past medical history significant for hypertension and alcohol abuse who presents to the ED after a fall for syncopal episode that occurred 2 days ago.  Patient states he was going from a sitting to standing position when he had significant low back pain.  He notes back pain was so severe that it caused him to "pass out".  Patient states he woke up on the ground.  Admits to a headache, neck pain, and low back pain.  Patient has a history of chronic low back pain.  Denies saddle anesthesia, bowel/bladder incontinence, lower extremity numbness/tingling, lower extremity weakness.  No fever or chills.  No IV drug use.  Patient also admits to left shoulder and left elbow pain.  States he has some numbness/tingling in left thumb which is chronic however, worse than his baseline.  Admits to 4 episodes of nonbloody, nonbilious emesis over the past 2 days.  Also admits to some dark urine.  Recently treated for UTI with unknown antibiotics. Unclear if patient finished his antibiotics. Denies visual and speech changes. No unilateral weakness. Not on any blood thinners.   History obtained from patient and past medical records. No interpreter used during encounter.       Home Medications Prior to Admission medications   Medication Sig Start Date End Date Taking? Authorizing Provider  cephALEXin (KEFLEX) 500 MG capsule Take 1 capsule (500 mg total) by mouth 4 (four) times daily. 03/26/23  Yes Analysse Quinonez, Merla Riches, PA-C  HYDROcodone-acetaminophen (NORCO/VICODIN) 5-325 MG tablet Take 1 tablet by mouth every 6 (six) hours as needed. 03/26/23  Yes Amor Packard, Merla Riches, PA-C  tamsulosin (FLOMAX) 0.4 MG CAPS capsule Take 1 capsule (0.4 mg total) by mouth daily. 03/26/23  Yes Sotirios Navarro, Merla Riches, PA-C   acetaminophen (TYLENOL) 500 MG tablet Take 500 mg by mouth every 6 (six) hours as needed for moderate pain.    [provider]  ALPRAZolam Prudy Feeler) 1 MG tablet Take one tablet 30 min prior to MRI Patient not taking: Reported on 02/04/2023 10/25/20   Storm Frisk, MD  amLODipine (NORVASC) 5 MG tablet Take 1 tablet (5 mg total) by mouth daily. 10/22/22   Hoy Register, MD  diazepam (VALIUM) 5 MG tablet Take 2 tablets 1 hr before MRI scan. Take extra tablet with you Patient not taking: Reported on 02/04/2023 07/25/21   Eldred Manges, MD  DULoxetine (CYMBALTA) 60 MG capsule Take 1 capsule (60 mg total) by mouth daily. 04/22/22   Hoy Register, MD  gabapentin (NEURONTIN) 300 MG capsule TAKE 1 CAPSULE (300 MG TOTAL) BY MOUTH 2 (TWO) TIMES DAILY. 04/22/22 04/22/23  Hoy Register, MD  ibuprofen (ADVIL) 800 MG tablet Take 1 tablet (800 mg total) by mouth 3 (three) times daily with meals. 01/02/23   Mardella Layman, MD  lidocaine (LIDODERM) 5 % Place 1 patch onto the skin daily as needed. Apply patch to area most significant pain once per day.  Remove and discard patch within 12 hours of application. 04/22/22   Hoy Register, MD  metaxalone (SKELAXIN) 800 MG tablet Take 1 tablet (800 mg total) by mouth 3 (three) times daily. 04/22/22   Hoy Register, MD  nicotine (NICODERM CQ) 14 mg/24hr patch Place 1 patch (14 mg total) onto the skin daily.  01/11/21   Hoy Register, MD  traZODone (DESYREL) 50 MG tablet Take 1 tablet (50 mg total) by mouth at bedtime as needed for sleep. 10/25/22   Hoy Register, MD  Vitamin D, Ergocalciferol, (DRISDOL) 1.25 MG (50000 UNIT) CAPS capsule Take 1 capsule (50,000 Units total) by mouth every 7 (seven) days. 10/19/21   Anders Simmonds, PA-C      Allergies    Patient has no known allergies.    Review of Systems   Review of Systems  Eyes:  Negative for visual disturbance.  Respiratory:  Negative for shortness of breath.   Cardiovascular:  Negative for chest pain.   Gastrointestinal:  Positive for nausea and vomiting.  Musculoskeletal:  Positive for arthralgias, back pain and neck pain.  Neurological:  Positive for numbness. Negative for weakness.    Physical Exam Updated Vital Signs BP (!) 141/99   Pulse (!) 56   Temp 97.9 F (36.6 C) (Oral)   Resp 16   SpO2 100%  Physical Exam Vitals and nursing note reviewed.  Constitutional:      General: He is not in acute distress.    Appearance: He is not ill-appearing.  HENT:     Head: Normocephalic.  Eyes:     Pupils: Pupils are equal, round, and reactive to light.  Neck:     Comments: No cervical midline tenderness.  Reproducible tenderness throughout left trapezius muscle. Cardiovascular:     Rate and Rhythm: Normal rate and regular rhythm.     Pulses: Normal pulses.     Heart sounds: Normal heart sounds. No murmur heard.    No friction rub. No gallop.  Pulmonary:     Effort: Pulmonary effort is normal.     Breath sounds: Normal breath sounds.  Abdominal:     General: Abdomen is flat. There is no distension.     Palpations: Abdomen is soft.     Tenderness: There is no abdominal tenderness. There is no guarding or rebound.  Musculoskeletal:        General: Normal range of motion.     Cervical back: Neck supple.     Comments: Resting midline tenderness.  Slight lumbar midline tenderness.  Bilateral lower extremities neurovascularly intact.   Skin:    General: Skin is warm and dry.  Neurological:     General: No focal deficit present.     Mental Status: He is alert.  Psychiatric:        Mood and Affect: Mood normal.        Behavior: Behavior normal.     ED Results / Procedures / Treatments   Labs (all labs ordered are listed, but only abnormal results are displayed) Labs Reviewed  BASIC METABOLIC PANEL - Abnormal; Notable for the following components:      Result Value   Sodium 133 (*)    Potassium 3.3 (*)    All other components within normal limits  URINALYSIS, ROUTINE W  REFLEX MICROSCOPIC - Abnormal; Notable for the following components:   Color, Urine AMBER (*)    APPearance CLOUDY (*)    Glucose, UA 50 (*)    Hgb urine dipstick LARGE (*)    Ketones, ur 5 (*)    Protein, ur 100 (*)    Nitrite POSITIVE (*)    Leukocytes,Ua SMALL (*)    Bacteria, UA RARE (*)    All other components within normal limits  HEPATIC FUNCTION PANEL - Abnormal; Notable for the following components:   Total Protein 8.5 (*)  Albumin 3.4 (*)    AST 62 (*)    ALT 66 (*)    All other components within normal limits  URINE CULTURE  CBC WITH DIFFERENTIAL/PLATELET  LIPASE, BLOOD    EKG EKG Interpretation Date/Time:  Wednesday March 26 2023 08:00:36 EDT Ventricular Rate:  71 PR Interval:  159 QRS Duration:  104 QT Interval:  393 QTC Calculation: 428 R Axis:   -40  Text Interpretation: Sinus rhythm Probable left atrial enlargement Inferior infarct, old Confirmed by Vonita Moss [phone]) on 03/26/2023 8:05:56 AM  Radiology CT Lumbar Spine Wo Contrast  Result Date: 03/26/2023 CLINICAL DATA:  62 year old male status post fall 2 days ago, syncope. Ongoing pain. EXAM: CT LUMBAR SPINE WITHOUT CONTRAST TECHNIQUE: Multidetector CT imaging of the lumbar spine was performed without intravenous contrast administration. Multiplanar CT image reconstructions were also generated. RADIATION DOSE REDUCTION: This exam was performed according to the departmental dose-optimization program which includes automated exposure control, adjustment of the mA and/or kV according to patient size and/or use of iterative reconstruction technique. COMPARISON:  Lumbar radiographs 11/25/2006. FINDINGS: Segmentation: Normal. Alignment: Mild dextroconvex upper and levoconvex lower lumbar scoliosis with superimposed straightening of lordosis, grade 1 anterolisthesis of L4 on L5 (3 mm) and similar retrolisthesis of L5 on S1. Vertebrae: Mild chronic wedging of the L5 vertebral body appears stable since 2008.  And the other lumbar, visible lower thoracic levels appear intact. Intact visible sacrum and SI joints. Paraspinal and other soft tissues: Mild Aortoiliac calcified atherosclerosis. Evidence of an oblong 6 mm right renal calculus at the ureteropelvic junction on series 5, image 28, although no visible hydronephrosis. Negative visible other noncontrast abdominal viscera. In the pelvis there is nonspecific bladder wall thickening (series 5, image 120). Negative lumbar paraspinal soft tissues. Disc levels: Lumbar spine degeneration most pronounced as follows L2-L3: Disc space loss with bulky circumferential disc bulge. Mild vacuum disc. Moderate facet hypertrophy. Mild spinal stenosis suspected, moderate left L2 neural foraminal stenosis. L3-L4: Mild disc bulging but moderate to severe facet hypertrophy. Vacuum facet on the left. Mild spinal and foraminal stenosis suspected. L4-L5: Grade 1 anterolisthesis. Circumferential disc/pseudo disc and severe facet degeneration with bilateral vacuum facet. Borderline to mild spinal stenosis. Mild to moderate bilateral L4 foraminal stenosis. L5-S1: Severe disc space loss. Vacuum disc. Bulky circumferential disc osteophyte complex. Mild facet hypertrophy. No spinal stenosis. But severe right L5 foraminal stenosis. IMPRESSION: 1. No acute traumatic injury identified in the Lumbar Spine. Mild scoliosis and spondylolisthesis. Chronic mild wedging of the L5 vertebral body. 2. Fairly advanced lumbar spine degeneration L2-L3 through L5-S1, but only mild spinal stenosis suspected by CT. Up to moderate left L2 and up to severe right L5 neural foraminal stenosis. 3. Evidence of a 6 mm right renal calculus at the UPJ, but no hydronephrosis is evident. Nonspecific bladder wall thickening. Aortic Atherosclerosis (ICD10-I70.0). Electronically Signed   By: Odessa Fleming M.D.   On: 03/26/2023 09:53   CT Cervical Spine Wo Contrast  Result Date: 03/26/2023 CLINICAL DATA:  62 year old male status  post fall 2 days ago, syncope. Ongoing pain. EXAM: CT CERVICAL SPINE WITHOUT CONTRAST TECHNIQUE: Multidetector CT imaging of the cervical spine was performed without intravenous contrast. Multiplanar CT image reconstructions were also generated. RADIATION DOSE REDUCTION: This exam was performed according to the departmental dose-optimization program which includes automated exposure control, adjustment of the mA and/or kV according to patient size and/or use of iterative reconstruction technique. COMPARISON:  Head CT today.  Cervical spine CT 09/27/2022. FINDINGS: Alignment: Stable. Chronic  straightening of cervical lordosis with mild degenerative appearing anterolisthesis of C4 on C5, C7 on T1. Bilateral posterior element alignment is within normal limits. Skull base and vertebrae: Bone mineralization is within normal limits for age. Visualized skull base is intact. No atlanto-occipital dissociation. C1 and C2 appear intact and aligned. No acute osseous abnormality identified. Soft tissues and spinal canal: No prevertebral fluid or swelling. No visible canal hematoma. Bulky calcified carotid atherosclerosis on the right. Disc levels: Chronic and degenerative appearing facet ankylosis on the left at C4-C5. Chronic facet arthropathy on the right at C2-C3. Chronic lower cervical disc and endplate degeneration. Probably no significant spinal stenosis. Upper chest: Partially visible levoconvex upper thoracic scoliosis. Grossly intact visible upper thoracic levels. IMPRESSION: 1. No acute traumatic injury identified in the cervical spine. 2. 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 Electronically Signed   By: Odessa Fleming M.D.   On: 03/26/2023 09:48   CT Head Wo Contrast  Result Date: 03/26/2023 CLINICAL DATA:  62 year old male status post fall 2 days ago, syncope. Ongoing pain. EXAM: CT HEAD WITHOUT CONTRAST TECHNIQUE: Contiguous axial images were obtained from the base of the skull through the  vertex without intravenous contrast. RADIATION DOSE REDUCTION: This exam was performed according to the departmental dose-optimization program which includes automated exposure control, adjustment of the mA and/or kV according to patient size and/or use of iterative reconstruction technique. COMPARISON:  Head CT 09/27/2022. FINDINGS: Brain: Stable cerebral volume. Volume related mild extra-axial CSF bilaterally appears stable, within normal limits. No midline shift, ventriculomegaly, mass effect, evidence of mass lesion, intracranial hemorrhage or evidence of cortically based acute infarction. Scattered small areas of white matter hypodensity including anterior internal capsule involvement bilaterally. Stable gray-white matter differentiation throughout the brain. Vascular: No suspicious intracranial vascular hyperdensity. Mild generalized intracranial artery tortuosity. Calcified atherosclerosis at the skull base. Skull: No fracture identified. Sinuses/Orbits: Visualized paranasal sinuses and mastoids are stable and well aerated. Other: Left forehead scalp hematoma measures up to 4-5 mm in thickness. Underlying frontal bones and frontal sinuses appear intact. No scalp soft tissue gas. Visualized orbit soft tissues are within normal limits. IMPRESSION: 1. Left forehead scalp hematoma. No underlying skull fracture. 2. No acute intracranial abnormality, chronic white matter disease most commonly due to small vessel ischemia. Electronically Signed   By: Odessa Fleming M.D.   On: 03/26/2023 09:45   DG Shoulder Left  Result Date: 03/26/2023 CLINICAL DATA:  Syncope with fall onto left arm EXAM: LEFT SHOULDER - 4 VIEW COMPARISON:  None Available. FINDINGS: There is no evidence of fracture or dislocation. Degenerative changes of the glenohumeral joint. Soft tissues are unremarkable. IMPRESSION: No acute fracture or dislocation. Electronically Signed   By: Agustin Cree M.D.   On: 03/26/2023 09:42   DG Elbow Complete  Left  Result Date: 03/26/2023 CLINICAL DATA:  Syncope with fall 2 days ago and injury to the left arm EXAM: LEFT ELBOW - COMPLETE 4 VIEW COMPARISON:  None Available. FINDINGS: There is no evidence of fracture, dislocation, or joint effusion. There is no evidence of arthropathy or other focal bone abnormality. Soft tissues are unremarkable. IMPRESSION: No acute fracture or dislocation. Electronically Signed   By: Agustin Cree M.D.   On: 03/26/2023 09:39    Procedures Procedures    Medications Ordered in ED Medications  cefTRIAXone (ROCEPHIN) 1 g in sodium chloride 0.9 % 100 mL IVPB (1 g Intravenous New Bag/Given 03/26/23 1449)  sodium chloride 0.9 % bolus 1,000 mL (0 mLs Intravenous Stopped 03/26/23  1056)  prochlorperazine (COMPAZINE) injection 10 mg (10 mg Intravenous Given 03/26/23 1201)  diphenhydrAMINE (BENADRYL) injection 12.5 mg (12.5 mg Intravenous Given 03/26/23 1158)    ED Course/ Medical Decision Making/ A&P Clinical Course as of 03/26/23 1508  Wed Mar 26, 2023  1122 CT Lumbar Spine Wo Contrast [CS]    Clinical Course User Index [CS] Vanessa Ralphs, Student-PA                                 Medical Decision Making Amount and/or Complexity of Data Reviewed Labs: ordered. Decision-making details documented in ED Course. Radiology: ordered and independent interpretation performed. Decision-making details documented in ED Course. ECG/medicine tests: ordered and independent interpretation performed. Decision-making details documented in ED Course.  Risk Prescription drug management.   This patient presents to the ED for concern of headache, back pain, this involves an extensive number of treatment options, and is a complaint that carries with it a high risk of complications and morbidity.  The differential diagnosis includes intracranial bleed, migraine, bony fracture, infection, etc  62 year old male presents to the ED after syncopal episode that occurred 2 days ago.  Patient  states he was going from a sitting to standing position when he developed severe low back pain that caused him to "pass out".  History of chronic low back pain.  Denies saddle anesthesia, bowel/bladder incontinence, lower extremity numbness/tingling, lower extremity weakness.  No fever or chills.  No IV drug use.  Patient admits to a headache, neck pain, and low back pain.  Also admits to left upper extremity pain.  4 episodes of nonbloody, nonbilious emesis over the past 2 days.  No visual or speech changes.  Denies unilateral weakness.  Upon arrival, stable vitals.  Patient in no acute distress.  Normal neurological exam without any neurological deficits.  No cervical midline tenderness.  Slight lumbar midline tenderness.  Bilateral lower extremities neurovascularly intact.  Low suspicion for cauda equina or central cord compression. CT scans ordered to rule out intracranial bleed or bony fractures.  X-rays ordered to rule out bony fractures.  Routine labs.  UA due to recent UTI and dark urine.  CBC unremarkable.  No leukocytosis.  Normal hemoglobin.  BMP reassuring.  Hypokalemia 3.3.  Normal renal function.  LFTs mildly elevated.  Appear to be chronically elevated.  Patient has a history of alcohol abuse.  No right upper quadrant or epigastric tenderness.  Low suspicion for acute cholecystitis or pancreatitis.  Normal total bilirubin.  Normal lipase.  UA significant for positive nitrites and small leukocytes.  Rare bacteria.  Does appear to be slightly contaminated.  Rocephin given due to urinary symptoms to cover for possible acute cystitis. Urine culture pending.  CT lumbar spine personally reviewed and interpreted which is negative for any bony fractures.  Does demonstrate a 6 mm renal calculus at UPJ. CT head and cervical spine  negative for an traumatic injuries. X-rays negative for any bony fractures.  2:44 PM Discussed with Sheria Lang with urology who spoke to attending who does not feel 6mm stone and UA  results require admission for stent placement. Feel UA is likely contaminated. Patient given does of rocephin in the ED. Will discharge with Keflex given urinary symptoms. Advised patient to call the urology office to schedule an appointment for further evaluation. Urine culture pending. Patient on cardiac monitoring for over 7 hours with no evidence of arrhythmias. Patient stable for discharge. Strict ED  precautions discussed with patient. Patient states understanding and agrees to plan. Patient discharged home in no acute distress and stable vitals  Lives at home Has PCP Hx HTN       Final Clinical Impression(s) / ED Diagnoses Final diagnoses:  Syncope, unspecified syncope type  Kidney stone  Acute cystitis with hematuria    Rx / DC Orders ED Discharge Orders          Ordered    cephALEXin (KEFLEX) 500 MG capsule  4 times daily        03/26/23 1501    HYDROcodone-acetaminophen (NORCO/VICODIN) 5-325 MG tablet  Every 6 hours PRN        03/26/23 1503    tamsulosin (FLOMAX) 0.4 MG CAPS capsule  Daily        03/26/23 1503              Jesusita Oka 03/26/23 1511    Rondel Baton, MD 03/27/23 [phone]

## 2023-03-26 NOTE — ED Notes (Signed)
Pt ambulatory to and from restroom with steady gait 

## 2023-03-27 LAB — URINE CULTURE: Culture: NO GROWTH

## 2023-05-20 ENCOUNTER — Ambulatory Visit (HOSPITAL_COMMUNITY)
Admission: EM | Admit: 2023-05-20 | Discharge: 2023-05-20 | Disposition: A | Discharge status: Home / Self Care | Payer: Medicaid Other | Attending: Family Medicine | Admitting: Family Medicine

## 2023-05-20 ENCOUNTER — Encounter (HOSPITAL_COMMUNITY): Payer: Self-pay

## 2023-05-20 DIAGNOSIS — M542 Cervicalgia: Secondary | ICD-10-CM | POA: Diagnosis not present

## 2023-05-20 MED ORDER — KETOROLAC TROMETHAMINE 10 MG PO TABS: 10.0000 mg | ORAL_TABLET | Freq: Four times a day (QID) | ORAL | 0 refills | Status: AC | PRN

## 2023-05-20 MED ORDER — KETOROLAC TROMETHAMINE 30 MG/ML IJ SOLN
INTRAMUSCULAR | Status: AC
  Filled 2023-05-20: qty 1

## 2023-05-20 MED ORDER — TIZANIDINE HCL 4 MG PO TABS: 4.0000 mg | ORAL_TABLET | Freq: Every day | ORAL | 0 refills | Status: AC | PRN

## 2023-05-20 MED ORDER — KETOROLAC TROMETHAMINE 30 MG/ML IJ SOLN
30.0000 mg | Freq: Once | INTRAMUSCULAR | Status: AC
  Administered 2023-05-20: 30 mg via INTRAMUSCULAR

## 2023-05-20 NOTE — Discharge Instructions (Signed)
 You have been given a shot of Toradol 30 mg today.  Ketorolac 10 mg tablets--take 1 tablet every 6 hours as needed for pain.  This is the same medicine that is in the shot we just gave you   Take tizanidine 4 mg--1 every evening at bedtime as needed for muscle spasms; this medication can cause dizziness and sleepiness
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
## 2023-06-13 IMAGING — MR MR CERVICAL SPINE WO/W CM
5 of 8 series · 19 of 48 positions shown · IV contrast (gadavist)
Comparison: MRI cervical spine 12/09/2020

CLINICAL DATA: Chronic neck pain

EXAM:
MRI CERVICAL SPINE WITHOUT AND WITH CONTRAST
TECHNIQUE: Multiplanar and multiecho pulse sequences of the cervical spine, to
include the craniocervical junction and cervicothoracic junction,
were obtained without and with intravenous contrast.
CONTRAST:  7.5mL GADAVIST GADOBUTROL 1 MMOL/ML IV SOLN

[Series 2: T2 · sagittal · 3.0mm · 0.35mm/px · 3 of 18 slices shown (1 of 2)]
[im 1/18]
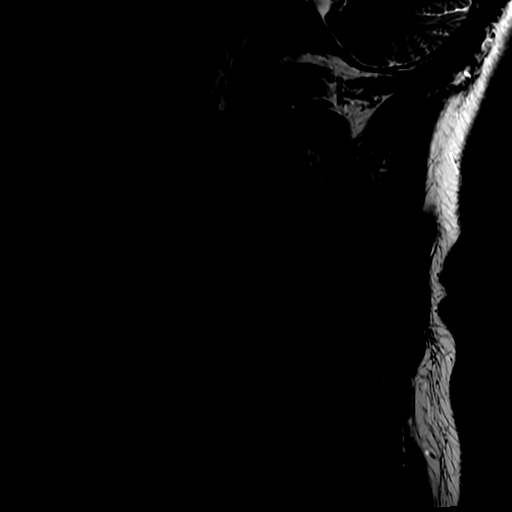
[im 9/18]
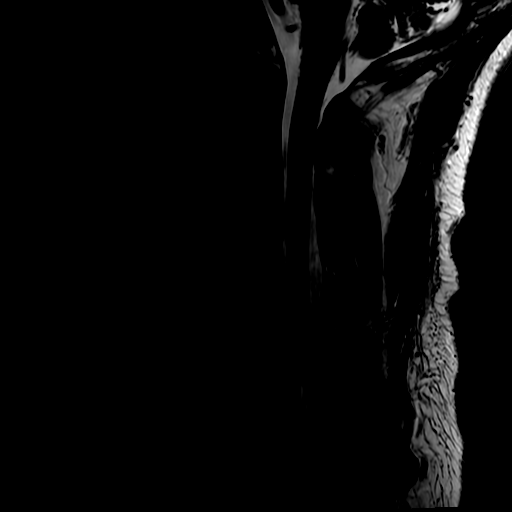
[im 18/18]
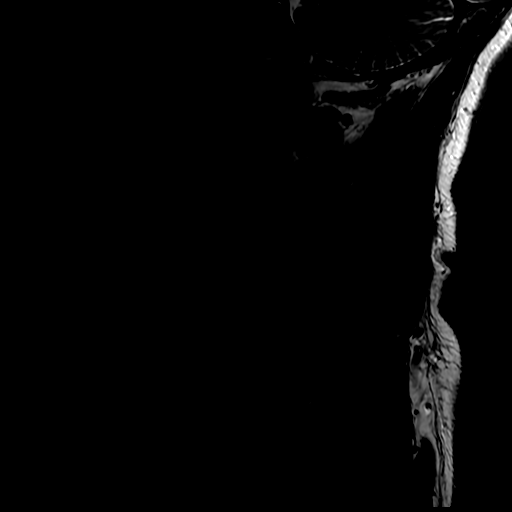

[Series 4: STIR · sagittal · 3.0mm · 0.35mm/px · 1 of 18 slices shown]
[im 1/18]
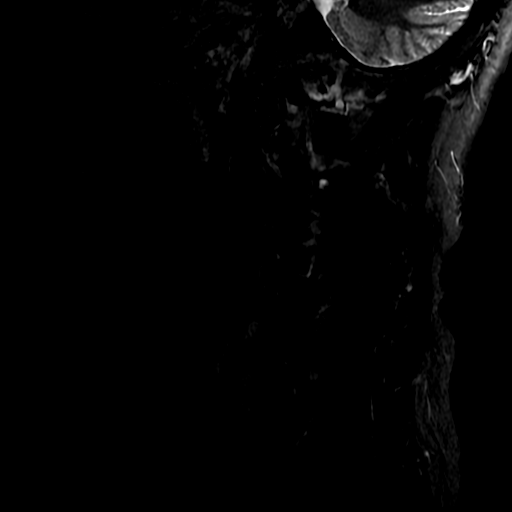

[Series 6: T2 · axial · 3.0mm · 0.35mm/px · z∈[-151,-25]mm · 6 of 40 slices shown (2 of 2)]
[im 1/40]
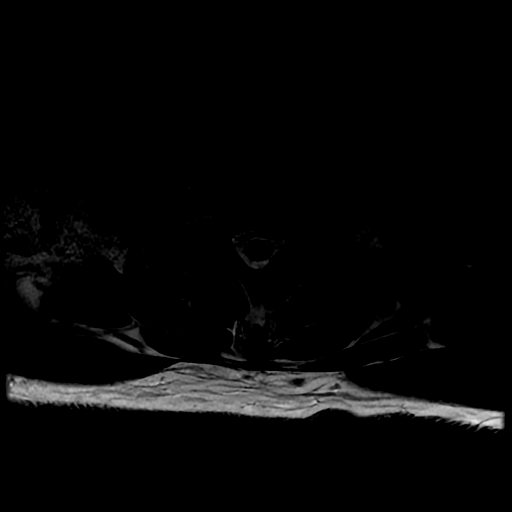
[im 8/40]
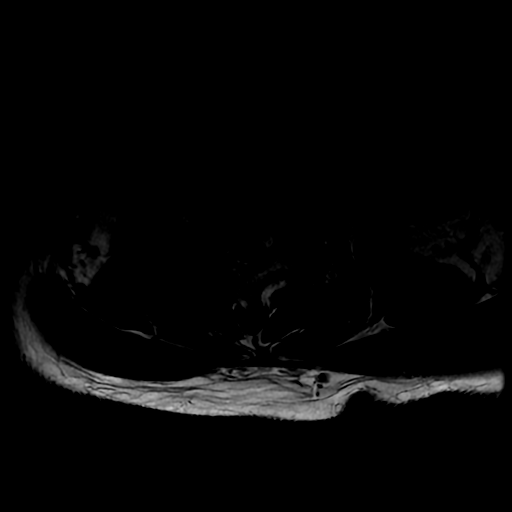
[im 16/40]
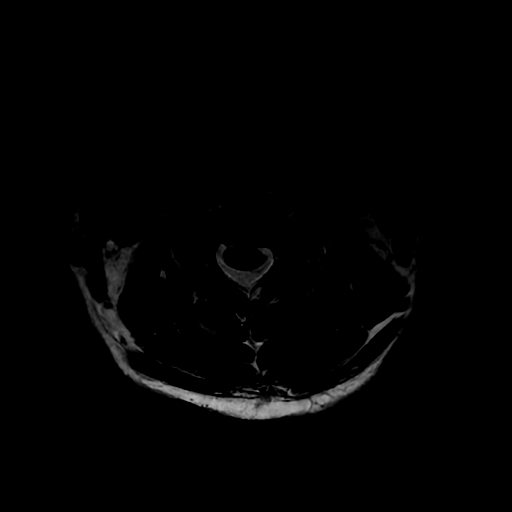
[im 24/40]
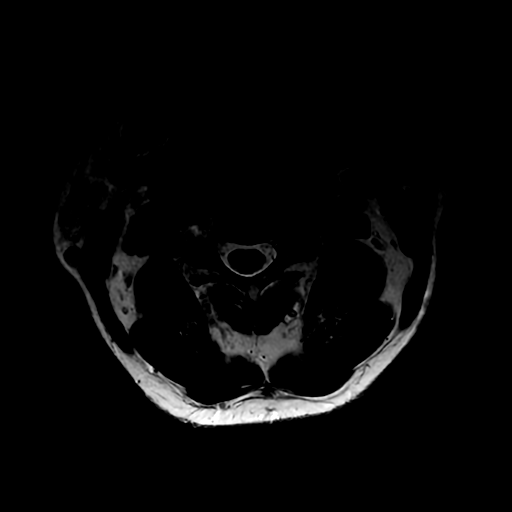
[im 32/40]
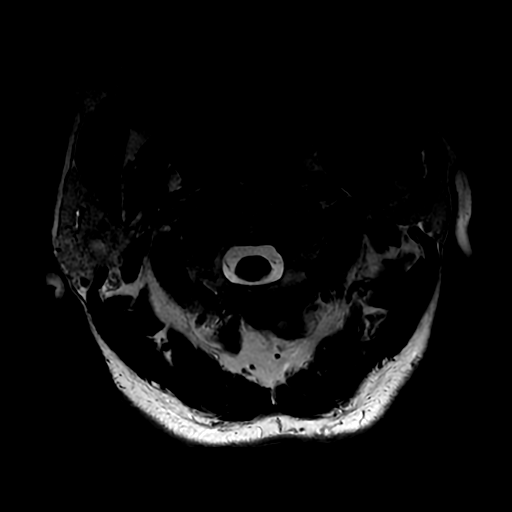
[im 40/40]
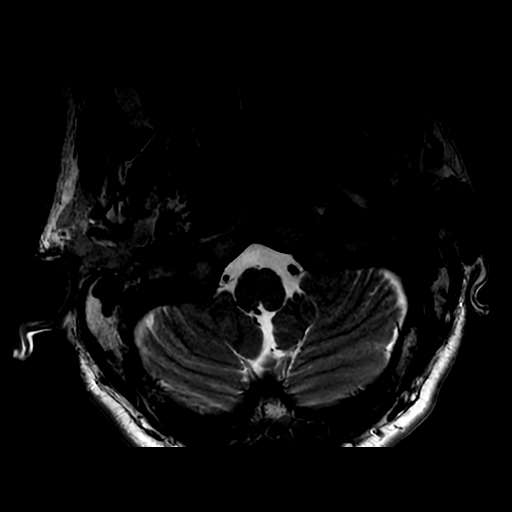

[Series 7: T1 · axial · non-contrast · 3.0mm · 0.35mm/px · z∈[-151,-25]mm · 6 of 39 slices shown]
[im 1/39]
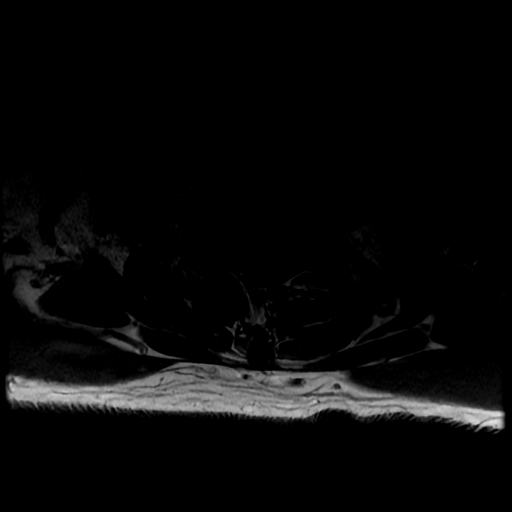
[im 8/39]
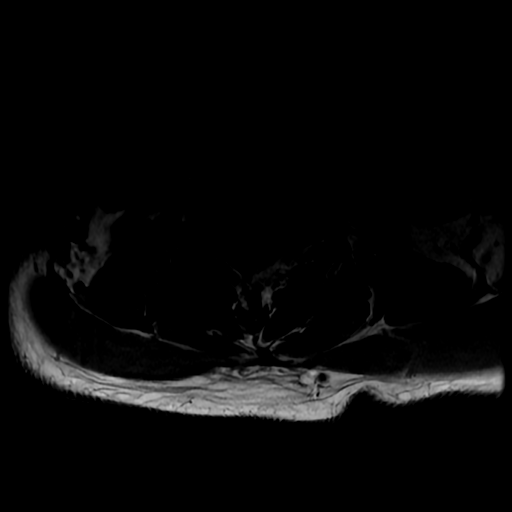
[im 16/39]
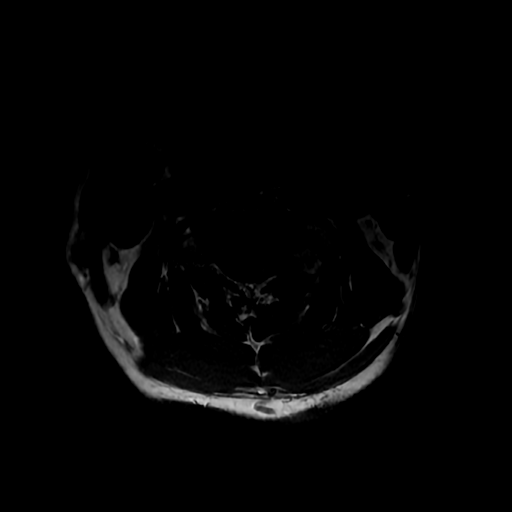
[im 23/39]
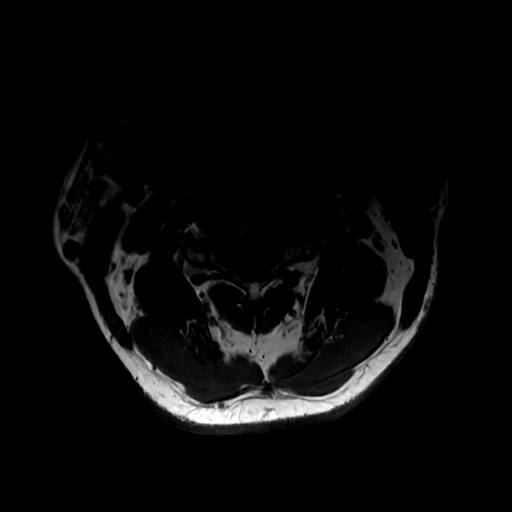
[im 31/39]
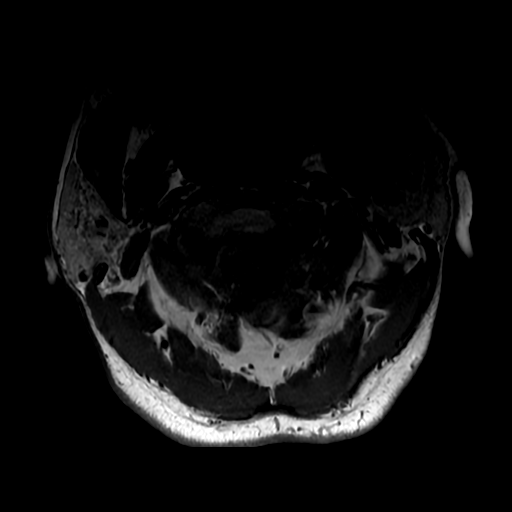
[im 39/39]
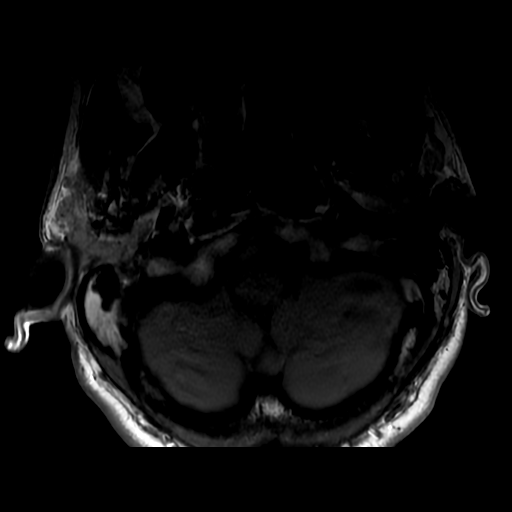

[Series 8: T1 fat-sat post-contrast · sagittal · 3.0mm · 0.35mm/px · 3 of 18 slices shown]
[im 1/18]
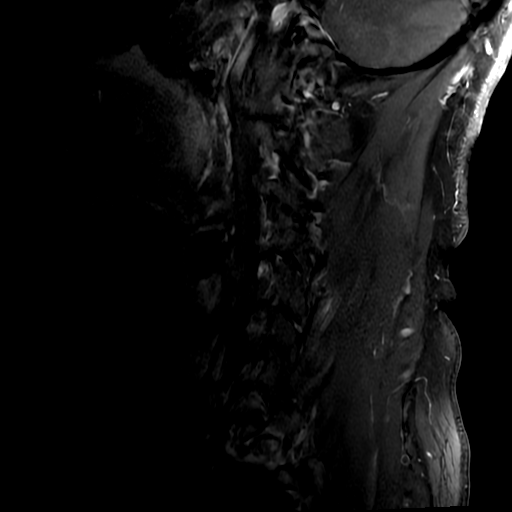
[im 9/18]
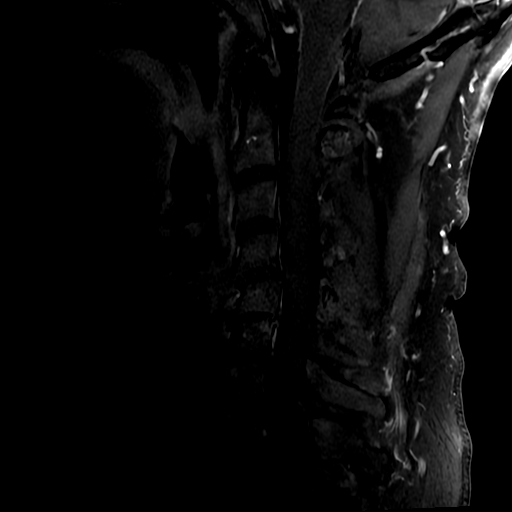
[im 18/18]
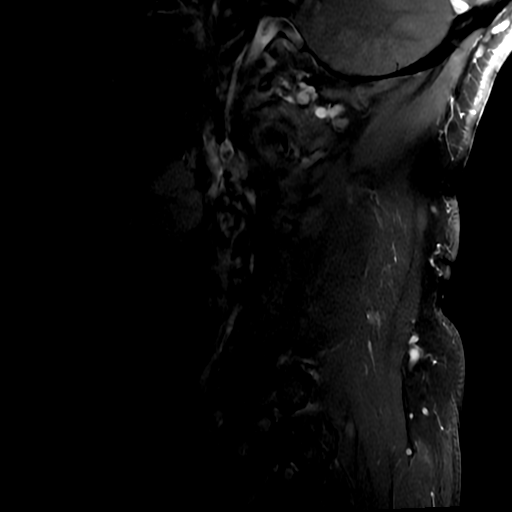

[19 of 48 positions shown; findings below may reference images not displayed]

FINDINGS: Alignment: Slight anterolisthesis C4-5. Mild retrolisthesis C5-6 and
C6-7. Mild scoliosis.

Vertebrae: Negative for fracture or mass

Cord: Normal signal and morphology.

Posterior Fossa, vertebral arteries, paraspinal tissues: Negative

Disc levels:

C2-3: Mild facet degeneration.  Mild right foraminal narrowing

C3-4: Mild disc and mild facet degeneration. Moderate left foraminal
narrowing.

C4-5: Mild anterolisthesis. Disc and facet degeneration. Moderate
left foraminal narrowing and mild right foraminal narrowing,
unchanged

C5-6: Disc and facet degeneration with spurring. Moderate foraminal
narrowing bilaterally, with progression. Spinal canal adequate in
size

C6-7: Disc and facet degeneration with spurring. Severe foraminal
narrowing bilaterally with progression from the prior study.

C7-T1: Bilateral facet hypertrophy. Moderate to severe left
foraminal encroachment, unchanged
IMPRESSION: Multilevel cervical spondylosis. Multilevel spinal and foraminal
stenosis as above

Progressive spurring and foraminal encroachment bilaterally at C5-6
and C6-7. Moderate to severe left foraminal encroachment C7-T1
unchanged.

## 2023-08-13 ENCOUNTER — Telehealth: Payer: Self-pay | Admitting: *Deleted

## 2023-08-13 NOTE — Telephone Encounter (Signed)
Chart reviewed and [employer] verification completed.   Chart review highlights and potential barriers to colorectal cancer screening (CRC) screening: Per patient's health maintenance, patient has never had colonoscopy.  Patient 's recent GI referral for colonoscopy was closed on 10/09/22 because patient did not return call to [facility] to schedule.  Referral was placed on 07/23/22.  Patient has also had successful patient outreach calls from [facility] Patient Outreach team member on 09/23/22 and 09/26/22.  Per CRC outreach note, patient's close friend Ether Griffins was assisting patient with scheduling colonoscopy and following up on insurance verification.  No colonoscopy results in [facility] ([facility] at this time.  Patient has no upcoming appointments scheduled in [facility].   Telephone call to patient, no answer, message states mailbox full, and unable to leave a message.    Classie Weng H. Docia Furl, Charity fundraiser, [facility] [phone]

## 2023-10-06 ENCOUNTER — Encounter: Payer: Self-pay | Admitting: Family Medicine

## 2023-10-06 ENCOUNTER — Other Ambulatory Visit: Payer: Self-pay

## 2023-10-06 ENCOUNTER — Other Ambulatory Visit: Payer: Self-pay | Admitting: Family Medicine

## 2023-10-06 ENCOUNTER — Telehealth: Payer: Self-pay

## 2023-10-06 ENCOUNTER — Ambulatory Visit: Payer: BLUE CROSS/BLUE SHIELD | Attending: Family Medicine | Admitting: Family Medicine

## 2023-10-06 VITALS — BP 157/103 | HR 78 | Ht 68.0 in | Wt 157.2 lb

## 2023-10-06 DIAGNOSIS — I10 Essential (primary) hypertension: Secondary | ICD-10-CM | POA: Diagnosis not present

## 2023-10-06 DIAGNOSIS — F32A Depression, unspecified: Secondary | ICD-10-CM | POA: Diagnosis not present

## 2023-10-06 DIAGNOSIS — M4802 Spinal stenosis, cervical region: Secondary | ICD-10-CM

## 2023-10-06 DIAGNOSIS — F419 Anxiety disorder, unspecified: Secondary | ICD-10-CM | POA: Diagnosis not present

## 2023-10-06 DIAGNOSIS — R7303 Prediabetes: Secondary | ICD-10-CM

## 2023-10-06 DIAGNOSIS — M5412 Radiculopathy, cervical region: Secondary | ICD-10-CM | POA: Diagnosis not present

## 2023-10-06 LAB — POCT GLYCOSYLATED HEMOGLOBIN (HGB A1C): HbA1c, POC (prediabetic range): 6.1 % (ref 5.7–6.4)

## 2023-10-06 MED ORDER — DULOXETINE HCL 60 MG PO CPEP
60.0000 mg | ORAL_CAPSULE | Freq: Every day | ORAL | 1 refills | Status: DC
  Filled 2023-10-06: qty 90, 90d supply, fill #0

## 2023-10-06 MED ORDER — AMLODIPINE BESYLATE 5 MG PO TABS
5.0000 mg | ORAL_TABLET | Freq: Every day | ORAL | 1 refills | Status: DC
Start: 2023-10-06 — End: 2024-04-07
  Filled 2023-10-06: qty 90, 90d supply, fill #0

## 2023-10-06 MED ORDER — GABAPENTIN 300 MG PO CAPS
600.0000 mg | ORAL_CAPSULE | Freq: Two times a day (BID) | ORAL | 3 refills | Status: DC
  Filled 2023-10-06: qty 120, 30d supply, fill #0

## 2023-10-06 MED ORDER — METAXALONE 800 MG PO TABS
800.0000 mg | ORAL_TABLET | Freq: Three times a day (TID) | ORAL | 3 refills | Status: DC
  Filled 2023-10-06 – 2023-10-07 (×3): qty 90, 30d supply, fill #0

## 2023-10-06 NOTE — Telephone Encounter (Signed)
 I met with the patient and Haywood Lasso when they were in the office for his appointment today.  Haywood Lasso explained that she cancelled his [employer] policy and she called [facility] to request a replacement card because his was lost and she has not received the new one in the mail.  I was able to give her a copy of his card.

## 2023-10-06 NOTE — Patient Instructions (Signed)
 VISIT SUMMARY:  During today's visit, we discussed your severe back and neck pain, which is affecting your mental health and daily activities. We also reviewed your high blood pressure and prediabetes. We have adjusted your medications and provided referrals and resources to help manage your conditions.  YOUR PLAN:  -CHRONIC PAIN: Chronic pain is long-lasting pain that can affect your mental and physical well-being. We have increased your gabapentin to 600 mg twice daily, restarted duloxetine at 60 mg daily, and continued metaxalone at 800 mg three times daily. We will refer you to an orthopedic specialist for a surgical evaluation and provide resources for counseling and mental health support.  -HYPERTENSION: Hypertension, or high blood pressure, can lead to serious health problems if not managed properly. We have restarted your amlodipine at 5 mg daily and emphasized the importance of taking your medication regularly to prevent complications such as stroke.  -PREDIABETES: Prediabetes is a condition where your blood sugar levels are higher than normal but not yet high enough to be classified as diabetes. We recommend reducing your intake of starches and sweets, increasing your consumption of vegetables and fruits, and engaging in regular exercise to prevent the progression to diabetes.  -GENERAL HEALTH MAINTENANCE: Maintaining a healthy lifestyle is crucial for overall well-being. We encourage daily physical activity, such as walking, advise against daytime napping to improve your sleep cycle, and suggest using caffeine to stay awake during the day if necessary.  INSTRUCTIONS:  Please follow up with the orthopedic specialist for a surgical evaluation. Utilize the provided resources for counseling and mental health support. Coordinate with the social worker to address any insurance and Medicaid card issues. Additionally, we will need to check your potassium levels and liver enzymes at your next  visit.

## 2023-10-06 NOTE — Progress Notes (Signed)
 Subjective:  Patient ID: Nicholas Roth, male    DOB: 09/09/1960  Age: 63 y.o. MRN: [id number]  CC: Medical Management of Chronic Issues (Back/neck pain/Discuss mental health)     Discussed the use of AI scribe software for clinical note transcription with the patient, who gave verbal consent to proceed.  History of Present Illness The patient, with a history of severe back and cervical radiculopathy, hypertension, presents with worsening symptoms. He reports that the pain is so severe it is affecting his mental health, causing feelings of "going crazy." The patient's stop corroborates this, noting that the patient's mind often wanders, he has lost interest in activities, and his sleep pattern is reversed, with the patient staying awake all night and sleeping during the day. The patient has been prescribed Cymbalta, gabapentin, and a muscle relaxant for his pain, but he has not been taking it and he reports that these medications have not provided any relief. He expresses a desire for surgical intervention to alleviate his pain. He was seen by Dr. Ophelia Charter of Ortho care in the past and he does not want to go back to see him.  He would like to have surgery to treat his condition.  In addition to his pain, the patient has high blood pressure and is prediabetic. He admits to not taking his amlodipine, a medication for high blood pressure, regularly.    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  Family History  Problem Relation Age of Onset   CAD Father     Social History   Socioeconomic History   Marital status: Single    Spouse name: Not on file   Number of children: Not on file   Years of education: Not on file   Highest education level: Not on file  Occupational History   Not on file  Tobacco Use   Smoking status: Every Day   Smokeless tobacco: Not on file  Vaping Use    Vaping status: Never Used  Substance and Sexual Activity   Alcohol use: Yes   Drug use: Yes    Types: Marijuana, Cocaine   Sexual activity: Not on file  Other Topics Concern   Not on file  Social History Narrative   Not on file   Social Drivers of Health   Financial Resource Strain: High Risk (10/22/2022)   Overall Financial Resource Strain (CARDIA)    Difficulty of Paying Living Expenses: Very hard  Food Insecurity: Not on file  Transportation Needs: Not on file  Physical Activity: Not on file  Stress: Not on file  Social Connections: Not on file    No Known Allergies  Outpatient Medications Prior to Visit  Medication Sig Dispense Refill   lidocaine (LIDODERM) 5 % Place 1 patch onto the skin daily as needed. Apply patch to area most significant pain once per day.  Remove and discard patch within 12 hours of application. 30 patch 3   traZODone (DESYREL) 50 MG tablet Take 1 tablet (50 mg total) by mouth at bedtime as needed for sleep. 30 tablet 1   amLODipine (NORVASC) 5 MG tablet Take 1 tablet (5 mg total) by mouth daily. 90 tablet 1   acetaminophen (TYLENOL) 500 MG tablet Take 500 mg by mouth every 6 (six) hours as needed for moderate pain. (Patient not taking: Reported on 10/06/2023)     ALPRAZolam (XANAX) 1 MG  tablet Take one tablet 30 min prior to MRI (Patient not taking: Reported on 10/06/2023) 1 tablet 0   diazepam (VALIUM) 5 MG tablet Take 2 tablets 1 hr before MRI scan. Take extra tablet with you (Patient not taking: Reported on 10/06/2023) 3 tablet 0   ketorolac (TORADOL) 10 MG tablet Take 1 tablet (10 mg total) by mouth every 6 (six) hours as needed (pain). (Patient not taking: Reported on 10/06/2023) 20 tablet 0   nicotine (NICODERM CQ) 14 mg/24hr patch Place 1 patch (14 mg total) onto the skin daily. (Patient not taking: Reported on 10/06/2023) 28 patch 2   tamsulosin (FLOMAX) 0.4 MG CAPS capsule Take 1 capsule (0.4 mg total) by mouth daily. (Patient not taking: Reported on  10/06/2023) 14 capsule 0   tiZANidine (ZANAFLEX) 4 MG tablet Take 1 tablet (4 mg total) by mouth daily as needed for muscle spasms. (Patient not taking: Reported on 10/06/2023) 10 tablet 0   Vitamin D, Ergocalciferol, (DRISDOL) 1.25 MG (50000 UNIT) CAPS capsule Take 1 capsule (50,000 Units total) by mouth every 7 (seven) days. (Patient not taking: Reported on 10/06/2023) 16 capsule 0   DULoxetine (CYMBALTA) 60 MG capsule Take 1 capsule (60 mg total) by mouth daily. (Patient not taking: Reported on 10/06/2023) 30 capsule 3   gabapentin (NEURONTIN) 300 MG capsule TAKE 1 CAPSULE (300 MG TOTAL) BY MOUTH 2 (TWO) TIMES DAILY. 60 capsule 3   metaxalone (SKELAXIN) 800 MG tablet Take 1 tablet (800 mg total) by mouth 3 (three) times daily. (Patient not taking: Reported on 10/06/2023) 90 tablet 3   No facility-administered medications prior to visit.     ROS Review of Systems  Constitutional:  Negative for activity change and appetite change.  HENT:  Negative for sinus pressure and sore throat.   Respiratory:  Negative for chest tightness, shortness of breath and wheezing.   Cardiovascular:  Negative for chest pain and palpitations.  Gastrointestinal:  Negative for abdominal distention, abdominal pain and constipation.  Genitourinary: Negative.   Musculoskeletal:  Positive for back pain and neck pain.  Psychiatric/Behavioral:  Negative for behavioral problems and dysphoric mood.     Objective:  BP (!) 157/103   Pulse 78   Ht 5\' 8"  (1.727 m)   Wt 157 lb 3.2 oz (71.3 kg)   SpO2 99%   BMI 23.90 kg/m      10/06/2023    1:54 PM 05/20/2023    8:17 PM 03/26/2023    3:22 PM  BP/Weight  Systolic BP 157 131 140  Diastolic BP 103 80 89  Wt. (Lbs) 157.2    BMI 23.9 kg/m2        Physical Exam Constitutional:      Appearance: He is well-developed.  Cardiovascular:     Rate and Rhythm: Normal rate.     Heart sounds: Normal heart sounds. No murmur heard. Pulmonary:     Effort: Pulmonary effort is  normal.     Breath sounds: Normal breath sounds. No wheezing or rales.  Chest:     Chest wall: No tenderness.  Abdominal:     General: Bowel sounds are normal. There is no distension.     Palpations: Abdomen is soft. There is no mass.     Tenderness: There is no abdominal tenderness.  Musculoskeletal:     Cervical back: Tenderness present.     Right lower leg: No edema.     Left lower leg: No edema.     Comments: Restricted range of motion of  bilateral upper extremity Lumbar spine tenderness to palpation  Neurological:     Mental Status: He is alert and oriented to person, place, and time.  Psychiatric:        Mood and Affect: Mood normal.        Latest Ref Rng & Units 03/26/2023    8:50 AM 03/26/2023    8:35 AM 09/27/2022   12:58 PM  CMP  Glucose 70 - 99 mg/dL  92  161   BUN 6 - 20 mg/dL  12  13   Creatinine 0.96 - 1.24 mg/dL  0.45  4.09   Sodium 811 - 145 mmol/L  133  134   Potassium 3.5 - 5.1 mmol/L  3.3  3.6   Chloride 98 - 111 mmol/L  98  99   CO2 22 - 32 mmol/L  24  24   Calcium 8.9 - 10.3 mg/dL  9.0  9.1   Total Protein 6.5 - 8.1 g/dL 8.5     Total Bilirubin 0.3 - 1.2 mg/dL 0.5     Alkaline Phos 38 - 126 U/L 50     AST 15 - 41 U/L 62     ALT 0 - 44 U/L 66       Lipid Panel  No results found for: "CHOL", "TRIG", "HDL", "CHOLHDL", "VLDL", "LDLCALC", "LDLDIRECT"  CBC    Component Value Date/Time   WBC 5.5 03/26/2023 0835   RBC 4.63 03/26/2023 0835   HGB 15.1 03/26/2023 0835   HGB 13.8 01/11/2021 1002   HCT 44.8 03/26/2023 0835   HCT 40.4 01/11/2021 1002   PLT 225 03/26/2023 0835   PLT 225 01/11/2021 1002   MCV 96.8 03/26/2023 0835   MCV 94 01/11/2021 1002   MCH 32.6 03/26/2023 0835   MCHC 33.7 03/26/2023 0835   RDW 14.5 03/26/2023 0835   RDW 13.7 01/11/2021 1002   LYMPHSABS 2.3 03/26/2023 0835   LYMPHSABS 2.9 01/11/2021 1002   MONOABS 0.9 03/26/2023 0835   EOSABS 0.0 03/26/2023 0835   EOSABS 0.1 01/11/2021 1002   BASOSABS 0.0 03/26/2023 0835    BASOSABS 0.0 01/11/2021 1002    Lab Results  Component Value Date   HGBA1C 6.1 10/06/2023       Assessment & Plan Chronic Pain secondary to cervical radiculopathy Associated upper extremity weakness and muscle wasting Severe chronic pain affecting mental health with depression and insomnia. Non-adherence to medications noted. Surgery desired but not immediately available. Emphasized medication adherence and discussed increasing gabapentin for sleep aid. - Increase gabapentin to 600 mg twice daily. - Restart duloxetine 60 mg daily. - Continue metaxalone 800 mg three times daily. - Refer to orthopedic specialist for surgical evaluation.   Hypertension Elevated blood pressure likely due to pain and non-adherence to medication. Emphasized importance of blood pressure control for potential surgery. - Restart amlodipine 5 mg daily. - Emphasize medication adherence to prevent complications such as stroke.  Prediabetes Slight increase in HbA1c from 6.0% to 6.1%. Advised dietary and exercise modifications to prevent diabetes progression. - Advise reducing starch and sweets intake. - Encourage increased consumption of vegetables and fruits. - Recommend regular exercise.  Depression -Secondary to chronic pain -Restart Cymbalta - Provide resources for counseling and mental health support.   General Health Maintenance Discussed lifestyle modifications to improve health and prevent disease progression. Addressed insomnia and daytime sleepiness cycle. - Encourage daily physical activity, such as walking. - Advise against daytime napping to improve sleep cycle. - Suggest using caffeine to stay awake during  the day if necessary.  Follow-up Outlined follow-up plans for continuity of care. Coordinated with Child psychotherapist for insurance issues. - Arrange referral to external orthopedic practice. - Provide resources for counseling and mental health support. - Coordinate with social worker to  address insurance and Medicaid card issues. - Check potassium levels and liver enzymes.      Meds ordered this encounter  Medications   amLODipine (NORVASC) 5 MG tablet    Sig: Take 1 tablet (5 mg total) by mouth daily.    Dispense:  90 tablet    Refill:  1   DULoxetine (CYMBALTA) 60 MG capsule    Sig: Take 1 capsule (60 mg total) by mouth daily.    Dispense:  90 capsule    Refill:  1   gabapentin (NEURONTIN) 300 MG capsule    Sig: Take 2 capsules (600 mg total) by mouth 2 (two) times daily.    Dispense:  120 capsule    Refill:  3    Dose increase   metaxalone (SKELAXIN) 800 MG tablet    Sig: Take 1 tablet (800 mg total) by mouth 3 (three) times daily.    Dispense:  90 tablet    Refill:  3    Follow-up: Return in about 6 months (around 04/07/2024).       Hoy Register, MD, FAAFP. [facility] [place], [place] [phone]   10/06/2023, 3:10 PM

## 2023-10-07 ENCOUNTER — Other Ambulatory Visit: Payer: Self-pay

## 2023-10-07 LAB — CMP14+EGFR
ALT: 86 IU/L — ABNORMAL HIGH (ref 0–44)
AST: 77 IU/L — ABNORMAL HIGH (ref 0–40)
Albumin: 3.9 g/dL (ref 3.8–4.9)
Alkaline Phosphatase: 61 IU/L (ref 44–121)
BUN/Creatinine Ratio: 13 (ref 10–24)
BUN: 13 mg/dL (ref 8–27)
Bilirubin Total: 0.3 mg/dL (ref 0.0–1.2)
CO2: 25 mmol/L (ref 20–29)
Calcium: 9.1 mg/dL (ref 8.6–10.2)
Chloride: 100 mmol/L (ref 96–106)
Creatinine, Ser: 0.98 mg/dL (ref 0.76–1.27)
Globulin, Total: 4 g/dL (ref 1.5–4.5)
Glucose: 92 mg/dL (ref 70–99)
Potassium: 3.7 mmol/L (ref 3.5–5.2)
Sodium: 138 mmol/L (ref 134–144)
Total Protein: 7.9 g/dL (ref 6.0–8.5)
eGFR: 88 mL/min/{1.73_m2} (ref >59)

## 2023-10-07 MED ORDER — LIDOCAINE 5 % EX PTCH
1.0000 | MEDICATED_PATCH | Freq: Every day | CUTANEOUS | 3 refills | Status: AC | PRN
  Filled 2023-10-07 – 2023-10-13 (×2): qty 30, 30d supply, fill #0

## 2023-10-13 ENCOUNTER — Other Ambulatory Visit: Payer: Self-pay

## 2023-10-13 ENCOUNTER — Telehealth: Payer: Self-pay

## 2023-10-13 NOTE — Telephone Encounter (Signed)
 Pharmacy Patient Advocate Encounter   Received notification from CoverMyMeds that prior authorization for LIDOCAINE 5% PATCH is required/requested.   Insurance verification completed.   The patient is insured through [facility] .   Per test claim: PA required; PA submitted to above mentioned insurance via CoverMyMeds Key/confirmation #/EOC [facility] Status is pending

## 2023-10-14 ENCOUNTER — Other Ambulatory Visit: Payer: Self-pay

## 2023-10-24 ENCOUNTER — Other Ambulatory Visit: Payer: Self-pay

## 2024-04-07 ENCOUNTER — Encounter: Payer: Self-pay | Admitting: Family Medicine

## 2024-04-07 ENCOUNTER — Other Ambulatory Visit: Payer: Self-pay

## 2024-04-07 ENCOUNTER — Ambulatory Visit: Attending: Family Medicine | Admitting: Family Medicine

## 2024-04-07 VITALS — BP 152/98 | HR 80 | Ht 68.0 in | Wt 142.6 lb

## 2024-04-07 DIAGNOSIS — R7303 Prediabetes: Secondary | ICD-10-CM | POA: Diagnosis not present

## 2024-04-07 DIAGNOSIS — M5412 Radiculopathy, cervical region: Secondary | ICD-10-CM

## 2024-04-07 DIAGNOSIS — F419 Anxiety disorder, unspecified: Secondary | ICD-10-CM

## 2024-04-07 DIAGNOSIS — F32A Depression, unspecified: Secondary | ICD-10-CM

## 2024-04-07 DIAGNOSIS — I10 Essential (primary) hypertension: Secondary | ICD-10-CM

## 2024-04-07 DIAGNOSIS — Z1211 Encounter for screening for malignant neoplasm of colon: Secondary | ICD-10-CM

## 2024-04-07 LAB — POCT GLYCOSYLATED HEMOGLOBIN (HGB A1C): HbA1c, POC (controlled diabetic range): 6.1 % (ref 0.0–7.0)

## 2024-04-07 MED ORDER — DULOXETINE HCL 60 MG PO CPEP
60.0000 mg | ORAL_CAPSULE | Freq: Every day | ORAL | 1 refills | Status: AC
  Filled 2024-04-07 (×2): qty 90, 90d supply, fill #0

## 2024-04-07 MED ORDER — METAXALONE 800 MG PO TABS
800.0000 mg | ORAL_TABLET | Freq: Three times a day (TID) | ORAL | 3 refills | Status: AC
Start: 2024-04-07 — End: ?
  Filled 2024-04-07 (×2): qty 90, 30d supply, fill #0

## 2024-04-07 MED ORDER — AMLODIPINE BESYLATE 5 MG PO TABS
5.0000 mg | ORAL_TABLET | Freq: Every day | ORAL | 1 refills | Status: AC
  Filled 2024-04-07 – 2024-05-11 (×4): qty 90, 90d supply, fill #0

## 2024-04-07 MED ORDER — GABAPENTIN 300 MG PO CAPS
600.0000 mg | ORAL_CAPSULE | Freq: Two times a day (BID) | ORAL | 3 refills | Status: AC
  Filled 2024-04-07 (×2): qty 120, 30d supply, fill #0

## 2024-04-07 NOTE — Progress Notes (Signed)
 Subjective:  Patient ID: Nicholas Roth , male    DOB: 1961/05/10  Age: 63 y.o. MRN: [id number]  CC: Medical Management of Chronic Issues     Discussed the use of AI scribe software for clinical note transcription with the patient, who gave verbal consent to proceed.  History of Present Illness Nicholas Roth  is a 63 year old male with a history of severe back and cervical radiculopathy, hypertension  who presents with medication management issues and neck pain.  He is unable to manage his medications due to losing his house keys and medication, impacting his health conditions. He has not been taking amlodipine  for hypertension and Cymbalta  for cervical radiculopathy pain due to loss and financial constraints.  Neck pain persists, described as 'hurting all over.' He has not taken Cymbalta , which was intended for pain and depression. He feels depressed, acknowledging the lack of medication contributes to this state. He has been prescribed metaxalone  but has not picked it up due to financial difficulties. He has slowed down smoking due to financial constraints.   I had referred him to orthopedic in 09/2023 but he never made it there. He is also due for colon cancer screening and never followed through with this as well.  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  Family History  Problem Relation Age of Onset   CAD Father     Social History   Socioeconomic History   Marital status: Single    Spouse name: Not on file   Number of children: Not on file   Years of education: Not on file   Highest education level: Not on file  Occupational History   Not on file  Tobacco Use   Smoking status: Every Day   Smokeless tobacco: Not on file  Vaping Use   Vaping status: Never Used  Substance and Sexual Activity   Alcohol use: Yes   Drug use: Yes    Types: Marijuana,  Cocaine   Sexual activity: Not on file  Other Topics Concern   Not on file  Social History Narrative   Not on file   Social Drivers of Health   Financial Resource Strain: High Risk (10/22/2022)   Overall Financial Resource Strain (CARDIA)    Difficulty of Paying Living Expenses: Very hard  Food Insecurity: Not on file  Transportation Needs: Not on file  Physical Activity: Not on file  Stress: Not on file  Social Connections: Not on file    No Known Allergies  Outpatient Medications Prior to Visit  Medication Sig Dispense Refill   lidocaine  (LIDODERM ) 5 % Place 1 patch onto the skin daily as needed. Apply patch to area most significant pain once per day.  Remove and discard patch within 12 hours of application. 30 patch 3   traZODone  (DESYREL ) 50 MG tablet Take 1 tablet (50 mg total) by mouth at bedtime as needed for sleep. 30 tablet 1   amLODipine  (NORVASC ) 5 MG tablet Take 1 tablet (5 mg total) by mouth daily. 90 tablet 1   DULoxetine  (CYMBALTA ) 60 MG capsule Take 1 capsule (60 mg total) by mouth daily. 90 capsule 1   gabapentin  (NEURONTIN ) 300 MG capsule Take 2 capsules (600 mg total) by mouth 2 (two) times daily. 120 capsule 3   metaxalone  (SKELAXIN ) 800 MG tablet Take 1 tablet (800 mg total) by mouth 3 (three) times daily.  90 tablet 3   acetaminophen  (TYLENOL ) 500 MG tablet Take 500 mg by mouth every 6 (six) hours as needed for moderate pain. (Patient not taking: Reported on 04/07/2024)     ALPRAZolam  (XANAX ) 1 MG tablet Take one tablet 30 min prior to MRI (Patient not taking: Reported on 04/07/2024) 1 tablet 0   diazepam  (VALIUM ) 5 MG tablet Take 2 tablets 1 hr before MRI scan. Take extra tablet with you (Patient not taking: Reported on 04/07/2024) 3 tablet 0   ketorolac  (TORADOL ) 10 MG tablet Take 1 tablet (10 mg total) by mouth every 6 (six) hours as needed (pain). (Patient not taking: Reported on 04/07/2024) 20 tablet 0   nicotine  (NICODERM CQ ) 14 mg/24hr patch Place 1 patch (14 mg  total) onto the skin daily. (Patient not taking: Reported on 04/07/2024) 28 patch 2   tamsulosin  (FLOMAX ) 0.4 MG CAPS capsule Take 1 capsule (0.4 mg total) by mouth daily. (Patient not taking: Reported on 04/07/2024) 14 capsule 0   tiZANidine  (ZANAFLEX ) 4 MG tablet Take 1 tablet (4 mg total) by mouth daily as needed for muscle spasms. (Patient not taking: Reported on 04/07/2024) 10 tablet 0   Vitamin D , Ergocalciferol , (DRISDOL ) 1.25 MG (50000 UNIT) CAPS capsule Take 1 capsule (50,000 Units total) by mouth every 7 (seven) days. (Patient not taking: Reported on 04/07/2024) 16 capsule 0   No facility-administered medications prior to visit.     ROS Review of Systems  Constitutional:  Negative for activity change and appetite change.  HENT:  Negative for sinus pressure and sore throat.   Respiratory:  Negative for chest tightness, shortness of breath and wheezing.   Cardiovascular:  Negative for chest pain and palpitations.  Gastrointestinal:  Negative for abdominal distention, abdominal pain and constipation.  Genitourinary: Negative.   Musculoskeletal: Negative.   Psychiatric/Behavioral:  Positive for dysphoric mood. Negative for behavioral problems.     Objective:  BP (!) 152/98   Pulse 80   Ht 5' 8 (1.727 m)   Wt 142 lb 9.6 oz (64.7 kg)   SpO2 100%   BMI 21.68 kg/m      04/07/2024    2:51 PM 04/07/2024    2:22 PM 10/06/2023    1:54 PM  BP/Weight  Systolic BP 152 139 157  Diastolic BP 98 94 103  Wt. (Lbs)  142.6 157.2  BMI  21.68 kg/m2 23.9 kg/m2      Physical Exam Constitutional:      Appearance: He is well-developed.  Cardiovascular:     Rate and Rhythm: Normal rate.     Heart sounds: Normal heart sounds. No murmur heard. Pulmonary:     Effort: Pulmonary effort is normal.     Breath sounds: Normal breath sounds. No wheezing or rales.  Chest:     Chest wall: No tenderness.  Abdominal:     General: Bowel sounds are normal. There is no distension.     Palpations:  Abdomen is soft. There is no mass.     Tenderness: There is no abdominal tenderness.  Musculoskeletal:        General: Normal range of motion.     Right lower leg: No edema.     Left lower leg: No edema.  Neurological:     Mental Status: He is alert and oriented to person, place, and time.  Psychiatric:     Comments: Dysphoric mood        Latest Ref Rng & Units 10/06/2023    2:44 PM 03/26/2023  8:50 AM 03/26/2023    8:35 AM  CMP  Glucose 70 - 99 mg/dL 92   92   BUN 8 - 27 mg/dL 13   12   Creatinine 9.23 - 1.27 mg/dL 9.01   9.07   Sodium 865 - 144 mmol/L 138   133   Potassium 3.5 - 5.2 mmol/L 3.7   3.3   Chloride 96 - 106 mmol/L 100   98   CO2 20 - 29 mmol/L 25   24   Calcium 8.6 - 10.2 mg/dL 9.1   9.0   Total Protein 6.0 - 8.5 g/dL 7.9  8.5    Total Bilirubin 0.0 - 1.2 mg/dL 0.3  0.5    Alkaline Phos 44 - 121 IU/L 61  50    AST 0 - 40 IU/L 77  62    ALT 0 - 44 IU/L 86  66      Lipid Panel  No results found for: CHOL, TRIG, HDL, CHOLHDL, VLDL, LDLCALC, LDLDIRECT  CBC    Component Value Date/Time   WBC 5.5 03/26/2023 0835   RBC 4.63 03/26/2023 0835   HGB 15.1 03/26/2023 0835   HGB 13.8 01/11/2021 1002   HCT 44.8 03/26/2023 0835   HCT 40.4 01/11/2021 1002   PLT 225 03/26/2023 0835   PLT 225 01/11/2021 1002   MCV 96.8 03/26/2023 0835   MCV 94 01/11/2021 1002   MCH 32.6 03/26/2023 0835   MCHC 33.7 03/26/2023 0835   RDW 14.5 03/26/2023 0835   RDW 13.7 01/11/2021 1002   LYMPHSABS 2.3 03/26/2023 0835   LYMPHSABS 2.9 01/11/2021 1002   MONOABS 0.9 03/26/2023 0835   EOSABS 0.0 03/26/2023 0835   EOSABS 0.1 01/11/2021 1002   BASOSABS 0.0 03/26/2023 0835   BASOSABS 0.0 01/11/2021 1002    Lab Results  Component Value Date   HGBA1C 6.1 04/07/2024       Assessment & Plan Chronic pain secondary to cervical radiculopathy Chronic neck pain with radiation. Exacerbated by non-adherence to medication due to financial constraints and lost medications. -  Reorder Cymbalta  and metaxalone  for pain management. - Provide contact information for orthopedic specialist for further evaluation and potential surgical consultation.  Anxiety and depression  Depression symptoms exacerbated by chronic pain and non-adherence to Cymbalta  due to financial constraints. - Reorder Cymbalta  to address depression symptoms.  Hypertension Elevated blood pressure due to non-adherence to amlodipine  after losing medication. - Reorder amlodipine  for blood pressure management.  Prediabetes Diet controlled with A1c of 6.1   General Health Maintenance Due for colon cancer screening. Discussed options for colonoscopy and stool test kit due to lack of support for colonoscopy preparation. - Order stool test kit for colon cancer screening. - Administer flu shot.    Healthcare maintenance Screening for colon cancer-Cologuard test ordered as he does not have anyone to accompany him for his colonoscopy  Meds ordered this encounter  Medications   amLODipine  (NORVASC ) 5 MG tablet    Sig: Take 1 tablet (5 mg total) by mouth daily.    Dispense:  90 tablet    Refill:  1   DULoxetine  (CYMBALTA ) 60 MG capsule    Sig: Take 1 capsule (60 mg total) by mouth daily.    Dispense:  90 capsule    Refill:  1   gabapentin  (NEURONTIN ) 300 MG capsule    Sig: Take 2 capsules (600 mg total) by mouth 2 (two) times daily.    Dispense:  120 capsule    Refill:  3   metaxalone  (SKELAXIN ) 800 MG tablet    Sig: Take 1 tablet (800 mg total) by mouth 3 (three) times daily.    Dispense:  90 tablet    Refill:  3    Follow-up: Return in about 6 months (around 10/05/2024).       Corrina Sabin, MD, FAAFP. [facility] [place], [place] [phone]   04/07/2024, 6:15 PM

## 2024-04-07 NOTE — Patient Instructions (Signed)
 Sent Referral [facility] Ph. # [phone] Fax# [phone]. Address [address]. They will contact the patient to schedule an appointment.

## 2024-04-08 ENCOUNTER — Other Ambulatory Visit: Payer: Self-pay

## 2024-04-15 ENCOUNTER — Other Ambulatory Visit: Payer: Self-pay

## 2024-04-16 ENCOUNTER — Other Ambulatory Visit: Payer: Self-pay

## 2024-05-11 ENCOUNTER — Other Ambulatory Visit: Payer: Self-pay

## 2024-06-03 ENCOUNTER — Other Ambulatory Visit: Payer: Self-pay

## 2024-10-05 ENCOUNTER — Ambulatory Visit: Admitting: Family Medicine
